# Patient Record
Sex: Female | Born: 1960
Health system: Southern US, Community
[De-identification: ages and names within clinical notes are randomized; demographics above are authoritative.]

## PROBLEM LIST (undated history)

## (undated) DIAGNOSIS — E119 Type 2 diabetes mellitus without complications: Secondary | ICD-10-CM

## (undated) DIAGNOSIS — I10 Essential (primary) hypertension: Secondary | ICD-10-CM

## (undated) DIAGNOSIS — F1910 Other psychoactive substance abuse, uncomplicated: Secondary | ICD-10-CM

---

## 2009-01-13 ENCOUNTER — Ambulatory Visit (HOSPITAL_COMMUNITY): Admission: RE | Admit: 2009-01-13 | Discharge: 2009-01-13 | Payer: Self-pay | Admitting: Obstetrics & Gynecology

## 2009-11-13 ENCOUNTER — Emergency Department (HOSPITAL_BASED_OUTPATIENT_CLINIC_OR_DEPARTMENT_OTHER): Admission: EM | Admit: 2009-11-13 | Discharge: 2009-11-13 | Payer: Self-pay | Admitting: Emergency Medicine

## 2009-12-22 ENCOUNTER — Emergency Department (HOSPITAL_BASED_OUTPATIENT_CLINIC_OR_DEPARTMENT_OTHER)
Admission: EM | Admit: 2009-12-22 | Discharge: 2009-12-22 | Payer: Self-pay | Source: Home / Self Care | Admitting: Emergency Medicine

## 2009-12-22 ENCOUNTER — Ambulatory Visit: Payer: Self-pay | Admitting: Diagnostic Radiology

## 2010-01-16 ENCOUNTER — Ambulatory Visit (HOSPITAL_COMMUNITY): Admission: RE | Admit: 2010-01-16 | Discharge: 2010-01-16 | Payer: Self-pay | Admitting: Family Medicine

## 2012-09-22 ENCOUNTER — Encounter (HOSPITAL_BASED_OUTPATIENT_CLINIC_OR_DEPARTMENT_OTHER): Payer: Self-pay | Admitting: *Deleted

## 2012-09-22 ENCOUNTER — Emergency Department (HOSPITAL_BASED_OUTPATIENT_CLINIC_OR_DEPARTMENT_OTHER)
Admission: EM | Admit: 2012-09-22 | Discharge: 2012-09-22 | Disposition: A | Discharge status: Home / Self Care | Payer: Self-pay | Attending: Emergency Medicine | Admitting: Emergency Medicine

## 2012-09-22 DIAGNOSIS — F17200 Nicotine dependence, unspecified, uncomplicated: Secondary | ICD-10-CM | POA: Insufficient documentation

## 2012-09-22 DIAGNOSIS — R509 Fever, unspecified: Secondary | ICD-10-CM | POA: Insufficient documentation

## 2012-09-22 DIAGNOSIS — R739 Hyperglycemia, unspecified: Secondary | ICD-10-CM

## 2012-09-22 DIAGNOSIS — R7309 Other abnormal glucose: Secondary | ICD-10-CM | POA: Insufficient documentation

## 2012-09-22 DIAGNOSIS — IMO0002 Reserved for concepts with insufficient information to code with codable children: Secondary | ICD-10-CM | POA: Insufficient documentation

## 2012-09-22 DIAGNOSIS — L02412 Cutaneous abscess of left axilla: Secondary | ICD-10-CM

## 2012-09-22 HISTORY — DX: Other psychoactive substance abuse, uncomplicated: F19.10

## 2012-09-22 LAB — GLUCOSE, CAPILLARY

## 2012-09-22 MED ORDER — OXYCODONE-ACETAMINOPHEN 5-325 MG PO TABS: 1.0000 | ORAL_TABLET | ORAL | Status: DC | PRN

## 2012-09-22 MED ORDER — HYDROMORPHONE HCL PF 1 MG/ML IJ SOLN
1.0000 mg | Freq: Once | INTRAMUSCULAR | Status: AC
  Administered 2012-09-22: 1 mg via INTRAMUSCULAR
  Filled 2012-09-22: qty 1

## 2012-09-22 MED ORDER — SULFAMETHOXAZOLE-TRIMETHOPRIM 800-160 MG PO TABS: 1.0000 | ORAL_TABLET | Freq: Two times a day (BID) | ORAL | Status: DC

## 2012-09-22 NOTE — ED Notes (Signed)
Left axiciallary abscess with scant purulent drainage very painful to touch pt medicated per order for pain. I&D tray with lidi/ epi at bedside.

## 2012-09-22 NOTE — ED Provider Notes (Signed)
History     This chart was scribed for Joya Gaskins, MD by Jiles Prows, ED Scribe. The patient was seen in room MH05/MH05 and the patient's care was started at 12:45 AM.   CSN: [id number] Arrival date & time 09/22/12  0023    Chief Complaint  Patient presents with  . Abscess   Patient is a 52 y.o. female presenting with abscess. The history is provided by the patient and medical records. No language interpreter was used.  Abscess Location:  Shoulder/arm Shoulder/arm abscess location:  L arm Abscess quality: painful   Red streaking: no   Duration:  6 days Progression:  Worsening Pain details:    Quality:  Pressure and throbbing   Severity:  Moderate   Duration:  6 days   Timing:  Constant   Progression:  Worsening Chronicity:  Recurrent Context: diabetes (possible diabetes)   Relieved by:  Nothing Worsened by:  Nothing tried Ineffective treatments:  None tried Associated symptoms: fever    HPI Comments: Jamie Frank is a 52 y.o. female who presents to the Emergency Department complaining of abscess to left axilla onset 6 days ago.  Pt reports history of abscess in this area.  She reports that she can normally apply clindamycin gel to area to clear up the problem.  She tried that this time with no relief.  Pt reports low grade fever (currently 98.7).  Pt denies headache, diaphoresis, chills, nausea, vomiting, diarrhea, weakness, cough, SOB and any other pain.   Past Medical History  Diagnosis Date  . Drug abuse     recovering for 2 yrs now   No past surgical history on file. No family history on file. History  Substance Use Topics  . Smoking status: Current Every Day Smoker    Types: Cigarettes  . Smokeless tobacco: Not on file  . Alcohol Use: No   OB History   Grav Para Term Preterm Abortions TAB SAB Ect Mult Living                 Review of Systems  Constitutional: Positive for fever.  Cardiovascular: Negative for chest pain.  Skin: Positive for  wound.  Neurological: Negative for weakness.  All other systems reviewed and are negative.    Allergies  Review of patient's allergies indicates not on file.  Home Medications   Current Outpatient Rx  Name  Route  Sig  Dispense  Refill  . metFORMIN (GLUCOPHAGE) 500 MG tablet   Oral   Take 500 mg by mouth 2 (two) times daily with a meal.          BP 128/63  Pulse 90  Temp(Src) 98.7 F (37.1 C) (Oral)  Resp 18  Ht 5\' 5"  (1.651 m)  Wt 260 lb (117.935 kg)  BMI 43.27 kg/m2  SpO2 98%  LMP 08/23/2012  Physical Exam CONSTITUTIONAL: Well developed/well nourished HEAD: Normocephalic/atraumatic EYES: EOMI/PERRL ENMT: Mucous membranes moist NECK: supple no meningeal signs SPINE:entire spine nontender CV: S1/S2 noted, no murmurs/rubs/gallops noted LUNGS: Lungs are clear to auscultation bilaterally, no apparent distress ABDOMEN: soft, nontender, no rebound or guarding GU:no cva tenderness NEURO: Pt is awake/alert, moves all extremitiesx4 EXTREMITIES: pulses normal, full ROM SKIN: warm, color normal, abscess to left axilla, no crepitus or drainage PSYCH: no abnormalities of mood noted   ED Course  Procedures   INCISION AND DRAINAGE Performed by: Joya Gaskins Consent: Verbal consent obtained. Risks and benefits: risks, benefits and alternatives were discussed Type: abscess  Body area: left  axilla  Anesthesia: local infiltration  Incision was made with a scalpel.  Local anesthetic: lidocaine 2% with epinephrine  Anesthetic total: 6 ml  Complexity: complex Blunt dissection to break up loculations  Drainage: purulent  Drainage amount: significant   Patient tolerance: Patient tolerated the procedure well with no immediate complications.     DIAGNOSTIC STUDIES: Oxygen Saturation is 98% on RA, normal by my interpretation.    COORDINATION OF CARE: 12:48 AM - Discussed ED treatment with pt at bedside including CBG, incision and drainage and pain  management (injection) and pt agrees.   Pt reported she "may have diabetes"  I told her that she does have diabetes.  I offered IV fluids, but she refused and reports she will f/u with her PCP for diabetic control.  She does not appear to be in DKA    Large amt of purulent material drained Two other areas of fluctuance were also incised, but no drainage from those wounds Pt stable for d/c and will start antibiotics  we discussed strict return precautions MDM  Nursing notes including past medical history and social history reviewed and considered in documentation Labs/vital reviewed and considered   I personally performed the services described in this documentation, which was scribed in my presence. The recorded information has been reviewed and is accurate.      Joya Gaskins, MD 09/22/12 [phone]

## 2012-09-22 NOTE — ED Notes (Signed)
Pt. Reports she has an abcess under the L arm x 6 days now.  Reports she has had abcess in the same area in past.

## 2015-04-13 ENCOUNTER — Encounter (HOSPITAL_BASED_OUTPATIENT_CLINIC_OR_DEPARTMENT_OTHER): Payer: Self-pay | Admitting: *Deleted

## 2015-04-13 ENCOUNTER — Emergency Department (HOSPITAL_BASED_OUTPATIENT_CLINIC_OR_DEPARTMENT_OTHER)
Admission: EM | Admit: 2015-04-13 | Discharge: 2015-04-13 | Disposition: A | Discharge status: Home / Self Care | Payer: Self-pay | Attending: Emergency Medicine | Admitting: Emergency Medicine

## 2015-04-13 DIAGNOSIS — F17210 Nicotine dependence, cigarettes, uncomplicated: Secondary | ICD-10-CM | POA: Insufficient documentation

## 2015-04-13 DIAGNOSIS — L0291 Cutaneous abscess, unspecified: Secondary | ICD-10-CM

## 2015-04-13 DIAGNOSIS — Z7984 Long term (current) use of oral hypoglycemic drugs: Secondary | ICD-10-CM | POA: Insufficient documentation

## 2015-04-13 DIAGNOSIS — E119 Type 2 diabetes mellitus without complications: Secondary | ICD-10-CM | POA: Insufficient documentation

## 2015-04-13 DIAGNOSIS — L02412 Cutaneous abscess of left axilla: Secondary | ICD-10-CM | POA: Insufficient documentation

## 2015-04-13 HISTORY — DX: Type 2 diabetes mellitus without complications: E11.9

## 2015-04-13 MED ORDER — OXYCODONE-ACETAMINOPHEN 5-325 MG PO TABS
1.0000 | ORAL_TABLET | Freq: Once | ORAL | Status: AC
  Administered 2015-04-13: 1 via ORAL
  Filled 2015-04-13: qty 1

## 2015-04-13 MED ORDER — LIDOCAINE-EPINEPHRINE (PF) 2 %-1:200000 IJ SOLN
10.0000 mL | Freq: Once | INTRAMUSCULAR | Status: AC
  Administered 2015-04-13: 10 mL via INTRADERMAL
  Filled 2015-04-13: qty 10

## 2015-04-13 MED ORDER — SULFAMETHOXAZOLE-TRIMETHOPRIM 800-160 MG PO TABS: 1.0000 | ORAL_TABLET | Freq: Two times a day (BID) | ORAL | Status: AC

## 2015-04-13 NOTE — ED Notes (Signed)
Abscess to her left axilla. 

## 2015-04-13 NOTE — ED Provider Notes (Signed)
CSN: [id number]     Arrival date & time 04/13/15  1915 History   First MD Initiated Contact with Patient 04/13/15 2030     Chief Complaint  Patient presents with  . Abscess     (Consider location/radiation/quality/duration/timing/severity/associated sxs/prior Treatment) Patient is a 55 y.o. female presenting with abscess. The history is provided by the patient and medical records. No language interpreter was used.  Abscess Associated symptoms: no fever, no headaches, no nausea and no vomiting    Jamie Frank is a 55 y.o. female  with a PMH of DM and multiple skin abscesses who presents to the Emergency Department for abscess of left axilla, worsening over the last 3-4 days. Pain is described as throbbing. Denies drainage. No medication taken PTA. Pt. States she has had >30 skin abscesses, multiple in each axilla - she has seen a specialist who told her that she has overactive sweat glands causing these abscesses. Denies fever at home.   Past Medical History  Diagnosis Date  . Drug abuse     recovering for 2 yrs now  . Diabetes mellitus without complication (HCC)    History reviewed. No pertinent past surgical history. No family history on file. Social History  Substance Use Topics  . Smoking status: Current Every Day Smoker    Types: Cigarettes  . Smokeless tobacco: None  . Alcohol Use: No   OB History    No data available     Review of Systems  Constitutional: Negative for fever and chills.  HENT: Negative for congestion.   Eyes: Negative for visual disturbance.  Respiratory: Negative for cough and shortness of breath.   Cardiovascular: Negative.   Gastrointestinal: Negative for nausea, vomiting and abdominal pain.  Genitourinary: Negative for dysuria.  Musculoskeletal: Negative for back pain and neck pain.  Skin: Positive for wound.  Neurological: Negative for dizziness, weakness and headaches.      Allergies  Review of patient's allergies indicates no known  allergies.  Home Medications   Prior to Admission medications   Medication Sig Start Date End Date Taking? Authorizing Provider  GLIPIZIDE PO Take by mouth.   Yes Historical Provider, MD  metFORMIN (GLUCOPHAGE) 500 MG tablet Take 500 mg by mouth 2 (two) times daily with a meal.    Historical Provider, MD  oxyCODONE-acetaminophen (PERCOCET/ROXICET) 5-325 MG per tablet Take 1 tablet by mouth every 4 (four) hours as needed for pain. 09/22/12   Zadie Rhine, MD  sulfamethoxazole-trimethoprim (BACTRIM DS,SEPTRA DS) 800-160 MG tablet Take 1 tablet by mouth 2 (two) times daily. 04/13/15 04/20/15  Chase Picket Abid Bolla, PA-C   BP 131/79 mmHg  Pulse 100  Temp(Src) 98.4 F (36.9 C) (Oral)  Resp 18  Ht  (1.651 m)  Wt 97.523 kg  BMI 35.78 kg/m2  SpO2 100% Physical Exam  Constitutional: She is oriented to person, place, and time. She appears well-developed and well-nourished.  Alert and in no acute distress  HENT:  Head: Normocephalic and atraumatic.  Cardiovascular: Normal rate, regular rhythm and normal heart sounds.  Exam reveals no gallop and no friction rub.   No murmur heard. Pulmonary/Chest: Effort normal and breath sounds normal. No respiratory distress. She has no wheezes. She has no rales.  Abdominal: Soft. She exhibits no distension. There is no tenderness.  Musculoskeletal: Normal range of motion.  Neurological: She is alert and oriented to person, place, and time.  Skin: Skin is warm and dry. No rash noted.  Left axilla with 2.5 cm tender area of  fluctuance with no surrounding erythema. No drainage.   Psychiatric: She has a normal mood and affect. Her behavior is normal. Judgment and thought content normal.  Nursing note and vitals reviewed.   ED Course  Procedures (including critical care time)  INCISION AND DRAINAGE Performed by: Chase Picket Mccade Sullenberger Consent: Verbal consent obtained. Risks and benefits: risks, benefits and alternatives were discussed Type: abscess Body  area: Left axilla Anesthesia: local infiltration Incision was made with a scalpel. Local anesthetic: lidocaine 2% with epinephrine Anesthetic total: 5 ml Complexity: complex Blunt dissection to break up loculations Drainage: purulent Drainage amount: copious  Packing material: 1/4 in iodoform gauze Patient tolerance: Patient tolerated the procedure well with no immediate complications.  Labs Review Labs Reviewed - No data to display  Imaging Review No results found. I have personally reviewed and evaluated these images and lab results as part of my medical decision-making.   EKG Interpretation None      MDM   Final diagnoses:  Abscess   Jamie Frank presents with abscess of left axilla which was I&D by me as dictated above. Pt. With hx of several skin abscesses who is followed by "specialist" - unsure what kind of physician she sees for this. Will treat with bactrim given frequency of recurrence and patient given general surgery follow up. Home care instructions, return precautions, and follow up instructions given to patient - all questions answered.   [facility] Jason Frisbee, PA-C 04/13/15 2440  Doug Sou, MD 04/13/15 [phone]

## 2015-04-13 NOTE — ED Notes (Signed)
C/o abscess under left arm x 4 days

## 2015-04-13 NOTE — Discharge Instructions (Signed)
Followup with your doctor or an urgent care in order to remove your packing in 48-72 hours. You may return to the emergency department if you have  a fever that persists greater than 101 or your abscess appears to become infected (growing surrounding redness and warmth). ° °Abscess °An abscess (boil or furuncle) is an infected area that contains a collection of pus.  °SYMPTOMS °Signs and symptoms of an abscess include pain, tenderness, redness, or hardness. You may feel a moveable soft area under your skin. An abscess can occur anywhere in the body.  °TREATMENT  °A surgical cut (incision) may be made over your abscess to drain the pus. Gauze may be packed into the space or a drain may be looped through the abscess cavity (pocket). This provides a drain that will allow the cavity to heal from the inside outwards. The abscess may be painful for a few days, but should feel much better if it was drained.  °Your abscess, if seen early, may not have localized and may not have been drained. If not, another appointment may be required if it does not get better on its own or with medications. °HOME CARE INSTRUCTIONS  °· Only take over-the-counter or prescription medicines for pain, discomfort, or fever as directed by your caregiver.  °· Take your antibiotics as directed if they were prescribed. Finish them even if you start to feel better.  °· Keep the skin and clothes clean around your abscess.  °· If the abscess was drained, you will need to use gauze dressing to collect any draining pus. Dressings will typically need to be changed 3 or more times a day.  °· The infection may spread by skin contact with others. Avoid skin contact as much as possible.  °· Practice good hygiene. This includes regular hand washing, cover any draining skin lesions, and do not share personal care items.  °· If you participate in sports, do not share athletic equipment, towels, whirlpools, or personal care items. Shower after every practice or  tournament.  °· If a draining area cannot be adequately covered:  °· Do not participate in sports.  °· Children should not participate in day care until the wound has healed or drainage stops.  °· If your caregiver has given you a follow-up appointment, it is very important to keep that appointment. Not keeping the appointment could result in a much worse infection, chronic or permanent injury, pain, and disability. If there is any problem keeping the appointment, you must call back to this facility for assistance.  °SEEK MEDICAL CARE IF:  °· You develop increased pain, swelling, redness, drainage, or bleeding in the wound site.  °· You develop signs of generalized infection including muscle aches, chills, fever, or a general ill feeling.  °· You have an oral temperature above 102° F (38.9° C).  °MAKE SURE YOU:  °· Understand these instructions.  °· Will watch your condition.  °· Will get help right away if you are not doing well or get worse.  °Document Released: 11/28/2004 Document Revised: 10/31/2010 Document Reviewed: 09/22/2007 °Exit[employer]® Patient Information ©2012 [employer]. ° °

## 2015-08-08 ENCOUNTER — Emergency Department (HOSPITAL_BASED_OUTPATIENT_CLINIC_OR_DEPARTMENT_OTHER)
Admission: EM | Admit: 2015-08-08 | Discharge: 2015-08-08 | Disposition: A | Discharge status: Home / Self Care | Payer: Self-pay | Attending: Emergency Medicine | Admitting: Emergency Medicine

## 2015-08-08 ENCOUNTER — Encounter (HOSPITAL_BASED_OUTPATIENT_CLINIC_OR_DEPARTMENT_OTHER): Payer: Self-pay | Admitting: *Deleted

## 2015-08-08 DIAGNOSIS — J069 Acute upper respiratory infection, unspecified: Secondary | ICD-10-CM | POA: Insufficient documentation

## 2015-08-08 DIAGNOSIS — Z7984 Long term (current) use of oral hypoglycemic drugs: Secondary | ICD-10-CM | POA: Insufficient documentation

## 2015-08-08 DIAGNOSIS — F17210 Nicotine dependence, cigarettes, uncomplicated: Secondary | ICD-10-CM | POA: Insufficient documentation

## 2015-08-08 DIAGNOSIS — E119 Type 2 diabetes mellitus without complications: Secondary | ICD-10-CM | POA: Insufficient documentation

## 2015-08-08 LAB — RAPID STREP SCREEN (MED CTR MEBANE ONLY): STREPTOCOCCUS, GROUP A SCREEN (DIRECT): NEGATIVE

## 2015-08-08 MED ORDER — GUAIFENESIN-CODEINE 100-10 MG/5ML PO SOLN: 5.0000 mL | Freq: Three times a day (TID) | ORAL | Status: DC | PRN

## 2015-08-08 NOTE — ED Notes (Signed)
Sore throat x 2 days

## 2015-08-08 NOTE — ED Provider Notes (Signed)
CSN: [id number]     Arrival date & time 08/08/15  2053 History   First MD Initiated Contact with Patient 08/08/15 2119     Chief Complaint  Patient presents with  . Sore Throat   HPI   55 year old female presents today with upper respiratory complaints. Patient reports that 3 days ago she started experiencing subjective fevers, cough, sore throat. Patient notes that symptoms have persisted, cough is dry and nonproductive, reports rhinorrhea, congestion. She denies any chest pain or shortness of breath, reports no fever today, no abdominal pain nausea or vomiting. Patient reports her throat is sore, it is painful to swallow, but has no difficulty swallowing or tolerating by mouth. Patient is smoker, she denies any weight loss, night sweats, preceding illness prior to this. Patient has no prior respiratory complaints. Patient reports using sinus medication which has not improved her cough or sore throat.   Past Medical History  Diagnosis Date  . Drug abuse     recovering for 2 yrs now  . Diabetes mellitus without complication (HCC)    History reviewed. No pertinent past surgical history. No family history on file. Social History  Substance Use Topics  . Smoking status: Current Every Day Smoker    Types: Cigarettes  . Smokeless tobacco: None  . Alcohol Use: No   OB History    No data available     Review of Systems  All other systems reviewed and are negative.   Allergies  Review of patient's allergies indicates no known allergies.  Home Medications   Prior to Admission medications   Medication Sig Start Date End Date Taking? Authorizing Provider  GLIPIZIDE PO Take by mouth.    Historical Provider, MD  guaiFENesin-codeine 100-10 MG/5ML syrup Take 5 mLs by mouth 3 (three) times daily as needed for cough. 08/08/15   Eyvonne Mechanic, PA-C  metFORMIN (GLUCOPHAGE) 500 MG tablet Take 500 mg by mouth 2 (two) times daily with a meal.    Historical Provider, MD  oxyCODONE-acetaminophen  (PERCOCET/ROXICET) 5-325 MG per tablet Take 1 tablet by mouth every 4 (four) hours as needed for pain. 09/22/12   Zadie Rhine, MD   BP 122/76 mmHg  Pulse 101  Temp(Src) 98.7 F (37.1 C) (Oral)  Resp 20  Ht  (1.651 m)  Wt 97.523 kg  BMI 35.78 kg/m2  SpO2 97%   Physical Exam  Constitutional: She is oriented to person, place, and time. She appears well-developed and well-nourished.  HENT:  Head: Normocephalic and atraumatic.  Right Ear: Hearing and tympanic membrane normal.  Left Ear: Hearing and tympanic membrane normal.  Nose: Nose normal. Right sinus exhibits no maxillary sinus tenderness and no frontal sinus tenderness. Left sinus exhibits no maxillary sinus tenderness and no frontal sinus tenderness.  Mouth/Throat: Uvula is midline, oropharynx is clear and moist and mucous membranes are normal. No oropharyngeal exudate, posterior oropharyngeal edema, posterior oropharyngeal erythema or tonsillar abscesses.  Eyes: Conjunctivae are normal. Pupils are equal, round, and reactive to light. Right eye exhibits no discharge. Left eye exhibits no discharge. No scleral icterus.  Neck: Normal range of motion. No JVD present. No tracheal deviation present.  Cardiovascular: Regular rhythm, normal heart sounds and intact distal pulses.  Exam reveals no gallop and no friction rub.   No murmur heard. Pulmonary/Chest: Effort normal and breath sounds normal. No stridor. No respiratory distress. She has no wheezes. She has no rales. She exhibits no tenderness.  Neurological: She is alert and oriented to person, place, and  time. Coordination normal.  Skin: Skin is warm and dry. No rash noted. No erythema. No pallor.  Psychiatric: She has a normal mood and affect. Her behavior is normal. Judgment and thought content normal.  Nursing note and vitals reviewed.    ED Course  Procedures (including critical care time) Labs Review Labs Reviewed  RAPID ST[facility]CREEN [facility])  CULTURE, GROUP A  STREP [facility])    Imaging Review No results found. I have personally reviewed and evaluated these images and lab results as part of my medical decision-making.   EKG Interpretation None      MDM   Final diagnoses:  Viral URI    Labs:   Imaging:  Consults:  Therapeutics:  Discharge Meds:   Assessment/Plan: 55 year old female presents today with likely viral URI. She is afebrile, nontoxic in no acute distress. She has clear lung sounds, no respiratory distress. Patient be treated symptomatically for her cough and sore throat, encouraged follow-up with the primary care, and strict return precautions given.        Eyvonne MechanicJeffrey Glenwood Revoir, PA-C 08/09/15 0101  Doug SouSam Jacubowitz, MD 08/09/15 1505

## 2015-08-08 NOTE — Discharge Instructions (Signed)
Please follow-up with your primary care provider or the ED if any new or worsening signs or symptoms present. Please use medication as directed, do not drink or drive all taking medication. Upper Respiratory Infection, Adult Most upper respiratory infections (URIs) are a viral infection of the air passages leading to the lungs. A URI affects the nose, throat, and upper air passages. The most common type of URI is nasopharyngitis and is typically referred to as "the common cold." URIs run their course and usually go away on their own. Most of the time, a URI does not require medical attention, but sometimes a bacterial infection in the upper airways can follow a viral infection. This is called a secondary infection. Sinus and middle ear infections are common types of secondary upper respiratory infections. Bacterial pneumonia can also complicate a URI. A URI can worsen asthma and chronic obstructive pulmonary disease (COPD). Sometimes, these complications can require emergency medical care and may be life threatening.  CAUSES Almost all URIs are caused by viruses. A virus is a type of germ and can spread from one person to another.  RISKS FACTORS You may be at risk for a URI if:   You smoke.   You have chronic heart or lung disease.  You have a weakened defense (immune) system.   You are very young or very old.   You have nasal allergies or asthma.  You work in crowded or poorly ventilated areas.  You work in health care facilities or schools. SIGNS AND SYMPTOMS  Symptoms typically develop 2-3 days after you come in contact with a cold virus. Most viral URIs last 7-10 days. However, viral URIs from the influenza virus (flu virus) can last 14-18 days and are typically more severe. Symptoms may include:   Runny or stuffy (congested) nose.   Sneezing.   Cough.   Sore throat.   Headache.   Fatigue.   Fever.   Loss of appetite.   Pain in your forehead, behind your eyes,  and over your cheekbones (sinus pain).  Muscle aches.  DIAGNOSIS  Your health care provider may diagnose a URI by:  Physical exam.  Tests to check that your symptoms are not due to another condition such as:  Strep throat.  Sinusitis.  Pneumonia.  Asthma. TREATMENT  A URI goes away on its own with time. It cannot be cured with medicines, but medicines may be prescribed or recommended to relieve symptoms. Medicines may help:  Reduce your fever.  Reduce your cough.  Relieve nasal congestion. HOME CARE INSTRUCTIONS   Take medicines only as directed by your health care provider.   Gargle warm saltwater or take cough drops to comfort your throat as directed by your health care provider.  Use a warm mist humidifier or inhale steam from a shower to increase air moisture. This may make it easier to breathe.  Drink enough fluid to keep your urine clear or pale yellow.   Eat soups and other clear broths and maintain good nutrition.   Rest as needed.   Return to work when your temperature has returned to normal or as your health care provider advises. You may need to stay home longer to avoid infecting others. You can also use a face mask and careful hand washing to prevent spread of the virus.  Increase the usage of your inhaler if you have asthma.   Do not use any tobacco products, including cigarettes, chewing tobacco, or electronic cigarettes. If you need help quitting, ask  your health care provider. PREVENTION  The best way to protect yourself from getting a cold is to practice good hygiene.   Avoid oral or hand contact with people with cold symptoms.   Wash your hands often if contact occurs.  There is no clear evidence that vitamin C, vitamin E, echinacea, or exercise reduces the chance of developing a cold. However, it is always recommended to get plenty of rest, exercise, and practice good nutrition.  SEEK MEDICAL CARE IF:   You are getting worse rather than  better.   Your symptoms are not controlled by medicine.   You have chills.  You have worsening shortness of breath.  You have brown or red mucus.  You have yellow or brown nasal discharge.  You have pain in your face, especially when you bend forward.  You have a fever.  You have swollen neck glands.  You have pain while swallowing.  You have white areas in the back of your throat. SEEK IMMEDIATE MEDICAL CARE IF:   You have severe or persistent:  Headache.  Ear pain.  Sinus pain.  Chest pain.  You have chronic lung disease and any of the following:  Wheezing.  Prolonged cough.  Coughing up blood.  A change in your usual mucus.  You have a stiff neck.  You have changes in your:  Vision.  Hearing.  Thinking.  Mood. MAKE SURE YOU:   Understand these instructions.  Will watch your condition.  Will get help right away if you are not doing well or get worse.   This information is not intended to replace advice given to you by your health care provider. Make sure you discuss any questions you have with your health care provider.   Document Released: 08/14/2000 Document Revised: 07/05/2014 Document Reviewed: 05/26/2013 Elsevier Interactive Patient Education [employer].

## 2015-08-11 LAB — CULTURE, GROUP A STREP (THRC)

## 2016-05-16 ENCOUNTER — Emergency Department (HOSPITAL_BASED_OUTPATIENT_CLINIC_OR_DEPARTMENT_OTHER): Payer: Self-pay

## 2016-05-16 ENCOUNTER — Emergency Department (HOSPITAL_BASED_OUTPATIENT_CLINIC_OR_DEPARTMENT_OTHER)
Admission: EM | Admit: 2016-05-16 | Discharge: 2016-05-16 | Disposition: A | Discharge status: Home / Self Care | Payer: Self-pay | Attending: Emergency Medicine | Admitting: Emergency Medicine

## 2016-05-16 ENCOUNTER — Encounter (HOSPITAL_BASED_OUTPATIENT_CLINIC_OR_DEPARTMENT_OTHER): Payer: Self-pay

## 2016-05-16 DIAGNOSIS — J069 Acute upper respiratory infection, unspecified: Secondary | ICD-10-CM | POA: Insufficient documentation

## 2016-05-16 DIAGNOSIS — F17210 Nicotine dependence, cigarettes, uncomplicated: Secondary | ICD-10-CM | POA: Insufficient documentation

## 2016-05-16 DIAGNOSIS — B9789 Other viral agents as the cause of diseases classified elsewhere: Secondary | ICD-10-CM

## 2016-05-16 DIAGNOSIS — Z7984 Long term (current) use of oral hypoglycemic drugs: Secondary | ICD-10-CM | POA: Insufficient documentation

## 2016-05-16 DIAGNOSIS — E119 Type 2 diabetes mellitus without complications: Secondary | ICD-10-CM | POA: Insufficient documentation

## 2016-05-16 MED ORDER — DM-GUAIFENESIN ER 30-600 MG PO TB12: 1.0000 | ORAL_TABLET | Freq: Two times a day (BID) | ORAL | 0 refills | Status: DC

## 2016-05-16 MED ORDER — FLUTICASONE PROPIONATE 50 MCG/ACT NA SUSP: 1.0000 | Freq: Every day | NASAL | 0 refills | Status: DC

## 2016-05-16 NOTE — ED Triage Notes (Signed)
c/o flu like sx x 6 days-states she needs a RTW note-NAD-steady gait

## 2016-05-16 NOTE — Discharge Instructions (Signed)
Please read and follow all provided instructions.  Your diagnoses today include:  1. Viral URI with cough     You appear to have an upper respiratory infection (URI). An upper respiratory tract infection, or cold, is a viral infection of the air passages leading to the lungs. It should improve gradually after 5-7 days. You may have a lingering cough that lasts for 2- 4 weeks after the infection.  Tests performed today include: Vital signs. See below for your results today.   Medications prescribed:   Take any prescribed medications only as directed. Treatment for your infection is aimed at treating the symptoms. There are no medications, such as antibiotics, that will cure your infection.   Home care instructions:  Follow any educational materials contained in this packet.   Your illness is contagious and can be spread to others, especially during the first 3 or 4 days. It cannot be cured by antibiotics or other medicines. Take basic precautions such as washing your hands often, covering your mouth when you cough or sneeze, and avoiding public places where you could spread your illness to others.   Please continue drinking plenty of fluids.  Use over-the-counter medicines as needed as directed on packaging for symptom relief.  You may also use ibuprofen or tylenol as directed on packaging for pain or fever.  Do not take multiple medicines containing Tylenol or acetaminophen to avoid taking too much of this medication.  Follow-up instructions: Please follow-up with your primary care provider in the next 3 days for further evaluation of your symptoms if you are not feeling better.   Return instructions:  Please return to the Emergency Department if you experience worsening symptoms.  RETURN IMMEDIATELY IF you develop shortness of breath, confusion or altered mental status, a new rash, become dizzy, faint, or poorly responsive, or are unable to be cared for at home. Please return if you have  persistent vomiting and cannot keep down fluids or develop a fever that is not controlled by tylenol or motrin.   Please return if you have any other emergent concerns.  Additional Information:  Your vital signs today were: BP (!) 138/96 (BP Location: Left Arm)    Pulse 73    Temp 98.1 F (36.7 C) (Oral)    Resp 18    Ht 5\' 5"  (1.651 m)    Wt 86.2 kg    SpO2 99%    BMI 31.62 kg/m  If your blood pressure (BP) was elevated above 135/85 this visit, please have this repeated by your doctor within one month. --------------

## 2016-05-16 NOTE — ED Provider Notes (Signed)
[facility]   CSN: [id number] Arrival date & time: 05/16/16  1548  By signing my name below, I, Rosario AdieWilliam Andrew Hiatt, attest that this documentation has been prepared under the direction and in the presence of Audry Piliyler Darline Faith, PA-C.  Electronically Signed: Rosario AdieWilliam Andrew Hiatt, ED Scribe. 05/16/16. 4:07 PM.  History   Chief Complaint Chief Complaint  Patient presents with  . Cough   The history is provided by the patient. No language interpreter was used.    HPI Comments: Jamie Frank is a 56 y.o. female with a h/o DM, who presents to the Emergency Department complaining of persistent, gradually worsening productive cough beginning six days ago. She notes associated chills, loss of appetite, rhinorrhea, congestion, and right ear pain. She additionally notes that she did have a fever several days ago (Tmax 101), but this has since resolved. Pt has been taking OTC cold relief medications without relief of her symptoms. She is a current, everyday smoker. Pt denies nausea, vomiting, diarrhea, chest pain, abdominal pain, or any other associated symptoms.   Past Medical History:  Diagnosis Date  . Diabetes mellitus without complication (HCC)   . Drug abuse    recovering for 2 yrs now   There are no active problems to display for this patient.  History reviewed. No pertinent surgical history.  OB History    No data available     Home Medications    Prior to Admission medications   Medication Sig Start Date End Date Taking? Authorizing Provider  GLIPIZIDE PO Take by mouth.    Historical Provider, MD  metFORMIN (GLUCOPHAGE) 500 MG tablet Take 500 mg by mouth 2 (two) times daily with a meal.    Historical Provider, MD   Family History No family history on file.  Social History Social History  Substance Use Topics  . Smoking status: Current Every Day Smoker    Types: Cigarettes  . Smokeless tobacco: Never Used  . Alcohol use No   Allergies   Patient  has no known allergies.  Review of Systems Review of Systems  Constitutional: Positive for appetite change, chills and fever (Tmax 101, resolved).  HENT: Positive for congestion, ear pain and rhinorrhea.   Respiratory: Positive for cough.   Cardiovascular: Negative for chest pain.  Gastrointestinal: Negative for abdominal pain, diarrhea, nausea and vomiting.   Physical Exam Updated Vital Signs BP (!) 138/96 (BP Location: Left Arm)   Pulse 73   Temp 98.1 F (36.7 C) (Oral)   Resp 18   Ht 5\' 5"  (1.651 m)   Wt 190 lb (86.2 kg)   SpO2 99%   BMI 31.62 kg/m   Physical Exam  Constitutional: She is oriented to person, place, and time. She appears well-developed and well-nourished. No distress.  HENT:  Head: Normocephalic and atraumatic.  Right Ear: Tympanic membrane, external ear and ear canal normal.  Left Ear: Tympanic membrane, external ear and ear canal normal.  Nose: Nose normal.  Mouth/Throat: Uvula is midline, oropharynx is clear and moist and mucous membranes are normal. No trismus in the jaw. No oropharyngeal exudate, posterior oropharyngeal erythema or tonsillar abscesses.  Eyes: Conjunctivae and EOM are normal. Pupils are equal, round, and reactive to light.  Neck: Normal range of motion. Neck supple. No tracheal deviation present.  Cardiovascular: Normal rate, regular rhythm, S1 normal, S2 normal, normal heart sounds, intact distal pulses and normal pulses.   Pulmonary/Chest: Effort normal and breath sounds normal. No respiratory distress. She has no decreased  breath sounds. She has no wheezes. She has no rhonchi. She has no rales.  Abdominal: Normal appearance and bowel sounds are normal. She exhibits no distension. There is no tenderness.  Musculoskeletal: Normal range of motion.  Neurological: She is alert and oriented to person, place, and time.  Skin: Skin is warm and dry. No pallor.  Psychiatric: She has a normal mood and affect. Her speech is normal and behavior is  normal. Thought content normal.  Nursing note and vitals reviewed.  ED Treatments / Results  DIAGNOSTIC STUDIES: Oxygen Saturation is 99% on RA, normal by my interpretation.   COORDINATION OF CARE: 4:07 PM-Discussed next steps with pt. Pt verbalized understanding and is agreeable with the plan.   Labs (all labs ordered are listed, but only abnormal results are displayed) Labs Reviewed - No data to display  EKG  EKG Interpretation None      Radiology No results found.  Procedures Procedures   Medications Ordered in ED Medications - No data to display  Initial Impression / Assessment and Plan / ED Course  I have reviewed the triage vital signs and the nursing notes.  Pertinent labs & imaging results that were available during my care of the patient were reviewed by me and considered in my medical decision making (see chart for details).  I have reviewed and evaluated the relevant imaging studies. I obtained HPI from historian.  ED Course:  Assessment: Pt is a 56 y.o. female presents with cough, congestion, rhinorrhea, general URI-like symptoms over the past six days. On exam, pt in NAD. VSS. Afebrile. Lungs CTA, Heart RRR. Abdomen nontender/soft. Pt CXR negative for acute infiltrate. Patients symptoms are consistent with URI, likely viral etiology. Discussed that antibiotics are not indicated for viral infections. Pt will be discharged with symptomatic treatment.  Given Rx Flonase and Mucinex. Verbalizes understanding and is agreeable with plan. Pt is hemodynamically stable & in NAD prior to dc.  Disposition/Plan:  DC Home Additional Verbal discharge instructions given and discussed with patient.  Pt Instructed to f/u with PCP in the next week for evaluation and treatment of symptoms. Return precautions given Pt acknowledges and agrees with plan  Supervising Physician Laurence Spates, MD  Final Clinical Impressions(s) / ED Diagnoses   Final diagnoses:  Viral  URI with cough   New Prescriptions New Prescriptions   No medications on file    I personally performed the services described in this documentation, which was scribed in my presence. The recorded information has been reviewed and is accurate.    Audry Pili, PA-C 05/16/16 1640    Laurence Spates, MD 05/17/16 1155

## 2016-05-16 NOTE — ED Notes (Signed)
ED Provider at bedside. 

## 2016-07-18 ENCOUNTER — Emergency Department (HOSPITAL_BASED_OUTPATIENT_CLINIC_OR_DEPARTMENT_OTHER): Payer: Self-pay

## 2016-07-18 ENCOUNTER — Emergency Department (HOSPITAL_BASED_OUTPATIENT_CLINIC_OR_DEPARTMENT_OTHER)
Admission: EM | Admit: 2016-07-18 | Discharge: 2016-07-18 | Disposition: A | Discharge status: Home / Self Care | Payer: Self-pay | Attending: Emergency Medicine | Admitting: Emergency Medicine

## 2016-07-18 ENCOUNTER — Encounter (HOSPITAL_BASED_OUTPATIENT_CLINIC_OR_DEPARTMENT_OTHER): Payer: Self-pay | Admitting: *Deleted

## 2016-07-18 DIAGNOSIS — N201 Calculus of ureter: Secondary | ICD-10-CM

## 2016-07-18 DIAGNOSIS — E119 Type 2 diabetes mellitus without complications: Secondary | ICD-10-CM | POA: Insufficient documentation

## 2016-07-18 DIAGNOSIS — N200 Calculus of kidney: Secondary | ICD-10-CM

## 2016-07-18 DIAGNOSIS — Z7984 Long term (current) use of oral hypoglycemic drugs: Secondary | ICD-10-CM | POA: Insufficient documentation

## 2016-07-18 DIAGNOSIS — N202 Calculus of kidney with calculus of ureter: Secondary | ICD-10-CM | POA: Insufficient documentation

## 2016-07-18 DIAGNOSIS — F17210 Nicotine dependence, cigarettes, uncomplicated: Secondary | ICD-10-CM | POA: Insufficient documentation

## 2016-07-18 LAB — COMPREHENSIVE METABOLIC PANEL
ALBUMIN: 3.4 g/dL — AB (ref 3.5–5.0)
ALK PHOS: 70 U/L (ref 38–126)
ALT: 15 U/L (ref 14–54)
AST: 18 U/L (ref 15–41)
Anion gap: 9 (ref 5–15)
BUN: 13 mg/dL (ref 6–20)
CALCIUM: 9.5 mg/dL (ref 8.9–10.3)
CO2: 25 mmol/L (ref 22–32)
Chloride: 106 mmol/L (ref 101–111)
Creatinine, Ser: 0.81 mg/dL (ref 0.44–1.00)
GFR calc Af Amer: >60 mL/min (ref >60)
GFR calc non Af Amer: >60 mL/min (ref >60)
GLUCOSE: 82 mg/dL (ref 65–99)
Potassium: 3.5 mmol/L (ref 3.5–5.1)
SODIUM: 140 mmol/L (ref 135–145)
Total Bilirubin: 0.2 mg/dL — ABNORMAL LOW (ref 0.3–1.2)
Total Protein: 6.5 g/dL (ref 6.5–8.1)

## 2016-07-18 LAB — URINALYSIS, ROUTINE W REFLEX MICROSCOPIC
BILIRUBIN URINE: NEGATIVE
BILIRUBIN URINE: NEGATIVE
Glucose, UA: NEGATIVE mg/dL
Glucose, UA: NEGATIVE mg/dL
Ketones, ur: NEGATIVE mg/dL
Ketones, ur: NEGATIVE mg/dL
NITRITE: NEGATIVE
Nitrite: NEGATIVE
PH: 6 (ref 5.0–8.0)
PROTEIN: NEGATIVE mg/dL
PROTEIN: NEGATIVE mg/dL
SPECIFIC GRAVITY, URINE: 1.012 (ref 1.005–1.030)
SPECIFIC GRAVITY, URINE: 1.022 (ref 1.005–1.030)
pH: 6 (ref 5.0–8.0)

## 2016-07-18 LAB — CBC WITH DIFFERENTIAL/PLATELET
BASOS PCT: 1 %
Basophils Absolute: 0 10*3/uL (ref 0.0–0.1)
EOS ABS: 0.1 10*3/uL (ref 0.0–0.7)
Eosinophils Relative: 1 %
HCT: 40.7 % (ref 36.0–46.0)
HEMOGLOBIN: 14.3 g/dL (ref 12.0–15.0)
Lymphocytes Relative: 37 %
Lymphs Abs: 2.7 10*3/uL (ref 0.7–4.0)
MCH: 31 pg (ref 26.0–34.0)
MCHC: 35.1 g/dL (ref 30.0–36.0)
MCV: 88.3 fL (ref 78.0–100.0)
Monocytes Absolute: 0.7 10*3/uL (ref 0.1–1.0)
Monocytes Relative: 10 %
NEUTROS PCT: 51 %
Neutro Abs: 3.7 10*3/uL (ref 1.7–7.7)
Platelets: 176 10*3/uL (ref 150–400)
RBC: 4.61 MIL/uL (ref 3.87–5.11)
RDW: 13.2 % (ref 11.5–15.5)
WBC: 7.2 10*3/uL (ref 4.0–10.5)

## 2016-07-18 LAB — URINALYSIS, MICROSCOPIC (REFLEX)

## 2016-07-18 LAB — LIPASE, BLOOD: LIPASE: 23 U/L (ref 11–51)

## 2016-07-18 MED ORDER — PROMETHAZINE HCL 25 MG PO TABS: 25.0000 mg | ORAL_TABLET | Freq: Four times a day (QID) | ORAL | 0 refills | Status: AC | PRN

## 2016-07-18 MED ORDER — OXYCODONE-ACETAMINOPHEN 5-325 MG PO TABS: 1.0000 | ORAL_TABLET | ORAL | 0 refills | Status: AC | PRN

## 2016-07-18 MED ORDER — TAMSULOSIN HCL 0.4 MG PO CAPS: 0.4000 mg | ORAL_CAPSULE | Freq: Every day | ORAL | 0 refills | Status: AC

## 2016-07-18 MED ORDER — ONDANSETRON HCL 4 MG/2ML IJ SOLN
4.0000 mg | Freq: Once | INTRAMUSCULAR | Status: AC
  Administered 2016-07-18: 4 mg via INTRAVENOUS
  Filled 2016-07-18: qty 2

## 2016-07-18 MED ORDER — IOPAMIDOL (ISOVUE-300) INJECTION 61%
100.0000 mL | Freq: Once | INTRAVENOUS | Status: AC | PRN
  Administered 2016-07-18: 100 mL via INTRAVENOUS

## 2016-07-18 MED ORDER — SODIUM CHLORIDE 0.9 % IV BOLUS (SEPSIS)
1000.0000 mL | Freq: Once | INTRAVENOUS | Status: AC
  Administered 2016-07-18: 1000 mL via INTRAVENOUS

## 2016-07-18 MED ORDER — MORPHINE SULFATE (PF) 4 MG/ML IV SOLN
4.0000 mg | Freq: Once | INTRAVENOUS | Status: AC
  Administered 2016-07-18: 4 mg via INTRAVENOUS
  Filled 2016-07-18: qty 1

## 2016-07-18 MED FILL — PROMETHAZINE 25 MG TABLET: 25 | 4 days supply | Qty: 15 | Fill #0

## 2016-07-18 MED FILL — TAMSULOSIN HCL 0.4 MG CAP: 0.4 | 30 days supply | Qty: 30 | Fill #0

## 2016-07-18 MED FILL — OXYCODONE/APAP 5/325 MG TAB: 5-325 | 2 days supply | Qty: 15 | Fill #0

## 2016-07-18 NOTE — ED Triage Notes (Signed)
Pt amb to room 1 with quick steady gait smiling in nad. Pt reports 1.5 weeks of generalized abd pain with diarrhea. Denies any v/d or fevers.

## 2016-07-18 NOTE — Discharge Instructions (Signed)
Read the information below.  Use the prescribed medication as directed.  Please discuss all new medications with your pharmacist.  Do not take additional tylenol while taking the prescribed pain medication to avoid overdose.  You may return to the Emergency Department at any time for worsening condition or any new symptoms that concern you.    If you develop high fevers, worsening abdominal pain, uncontrolled vomiting, or are unable to tolerate fluids by mouth, return to the ER for a recheck.   °

## 2016-07-18 NOTE — ED Notes (Signed)
Pt refusing In and Out Cath - notified EDP.

## 2016-07-18 NOTE — ED Provider Notes (Signed)
[facility]   CSN: [id number] Arrival date & time: 07/18/16  1021     History   Chief Complaint Chief Complaint  Patient presents with  . Abdominal Pain    HPI Jamie Frank is a 56 y.o. female.  HPI   Pt with hx DM p/w lower abdominal pain, nausea, and diarrhea x 1.5 weeks.  Has diarrhea whenever she eats anything. Had N/V a few days ago only.  Increased belching.  Denies fevers, urinary symptoms, hematochezia.  NO hx abdominal surgeries.  Unknown sick contacts.  No recent travel or abnormal/concerning foods.  No recent antibiotics.  Past Medical History:  Diagnosis Date  . Diabetes mellitus without complication (HCC)   . Drug abuse    recovering for 2 yrs now    There are no active problems to display for this patient.   History reviewed. No pertinent surgical history.  OB History    No data available       Home Medications    Prior to Admission medications   Medication Sig Start Date End Date Taking? Authorizing Provider  GLIPIZIDE PO Take by mouth.    [provider]  metFORMIN (GLUCOPHAGE) 500 MG tablet Take 500 mg by mouth 2 (two) times daily with a meal.    [provider]  oxyCODONE-acetaminophen (PERCOCET/ROXICET) 5-325 MG tablet Take 1-2 tablets by mouth every 4 (four) hours as needed for severe pain. 07/18/16   Trixie Dredge, PA-C  promethazine (PHENERGAN) 25 MG tablet Take 1 tablet (25 mg total) by mouth every 6 (six) hours as needed for nausea. 07/18/16   Trixie Dredge, PA-C  tamsulosin (FLOMAX) 0.4 MG CAPS capsule Take 1 capsule (0.4 mg total) by mouth daily. 07/18/16   Trixie Dredge, PA-C    Family History History reviewed. No pertinent family history.  Social History Social History  Substance Use Topics  . Smoking status: Current Every Day Smoker    Types: Cigarettes  . Smokeless tobacco: Never Used  . Alcohol use No     Allergies   Patient has no known allergies.   Review of Systems Review of  Systems  All other systems reviewed and are negative.    Physical Exam Updated Vital Signs BP 129/65   Pulse 82   Temp 97.5 F (36.4 C) (Oral)   Resp 16   Ht 5\' 5"  (1.651 m)   Wt 88.4 kg   SpO2 97%   BMI 32.43 kg/m   Physical Exam  Constitutional: She appears well-developed and well-nourished. No distress.  HENT:  Head: Normocephalic and atraumatic.  Neck: Neck supple.  Cardiovascular: Normal rate and regular rhythm.   Pulmonary/Chest: Effort normal and breath sounds normal. No respiratory distress. She has no wheezes. She has no rales.  Abdominal: Soft. She exhibits no distension. There is tenderness in the suprapubic area and left lower quadrant. There is no rebound and no guarding.  Neurological: She is alert.  Skin: She is not diaphoretic.  Nursing note and vitals reviewed.    ED Treatments / Results  Labs (all labs ordered are listed, but only abnormal results are displayed) Labs Reviewed  COMPREHENSIVE METABOLIC PANEL - Abnormal; Notable for the following:       Result Value   Albumin 3.4 (*)    Total Bilirubin 0.2 (*)    All other components within normal limits  URINALYSIS, ROUTINE W REFLEX MICROSCOPIC - Abnormal; Notable for the following:    APPearance CLOUDY (*)    Hgb urine dipstick  LARGE (*)    Leukocytes, UA LARGE (*)    All other components within normal limits  URINALYSIS, MICROSCOPIC (REFLEX) - Abnormal; Notable for the following:    Bacteria, UA MANY (*)    Squamous Epithelial / LPF 6-30 (*)    All other components within normal limits  URINALYSIS, ROUTINE W REFLEX MICROSCOPIC - Abnormal; Notable for the following:    APPearance CLOUDY (*)    Hgb urine dipstick MODERATE (*)    Leukocytes, UA LARGE (*)    All other components within normal limits  URINALYSIS, MICROSCOPIC (REFLEX) - Abnormal; Notable for the following:    Bacteria, UA FEW (*)    Squamous Epithelial / LPF 0-5 (*)    All other components within normal limits  URINE CULTURE    URINE CULTURE  CBC WITH DIFFERENTIAL/PLATELET  LIPASE, BLOOD    EKG  EKG Interpretation None       Radiology Ct Abdomen Pelvis W Contrast  Result Date: 07/18/2016 CLINICAL DATA:  Acute left lower quadrant abdominal pain, diarrhea EXAM: CT ABDOMEN AND PELVIS WITH CONTRAST TECHNIQUE: Multidetector CT imaging of the abdomen and pelvis was performed using the standard protocol following bolus administration of intravenous contrast. CONTRAST:  ISOVUE-300 IOPAMIDOL (ISOVUE-300) INJECTION 61% COMPARISON:  None. FINDINGS: Lower chest: No acute abnormality. Hepatobiliary: No focal liver abnormality is seen. No gallstones, gallbladder wall thickening, or biliary dilatation. Pancreas: Unremarkable. No pancreatic ductal dilatation or surrounding inflammatory changes. Spleen: Normal in size without focal abnormality. Adrenals/Urinary Tract: Adrenal glands are unremarkable. Large staghorn calculus is noted in right kidney. Moderate left hydronephrosis is noted secondary to 8 mm proximal left ureteral calculus. Urinary bladder is unremarkable. Stomach/Bowel: Stomach is within normal limits. Appendix appears normal. No evidence of bowel wall thickening, distention, or inflammatory changes. Vascular/Lymphatic: No significant vascular findings are present. No enlarged abdominal or pelvic lymph nodes. Reproductive: Uterus and bilateral adnexa are unremarkable. Other: No abdominal wall hernia or abnormality. No abdominopelvic ascites. Musculoskeletal: No acute or significant osseous findings. IMPRESSION: Large right renal staghorn calculus. Moderate left hydronephrosis is noted secondary to 8 mm proximal left ureteral calculus. Electronically Signed   By: Lupita Raider, M.D.   On: 07/18/2016 12:30    Procedures Procedures (including critical care time)  Medications Ordered in ED Medications  sodium chloride 0.9 % bolus 1,000 mL (0 mLs Intravenous Stopped 07/18/16 1259)  morphine 4 MG/ML injection 4 mg  (4 mg Intravenous Given 07/18/16 1219)  ondansetron (ZOFRAN) injection 4 mg (4 mg Intravenous Given 07/18/16 1219)  iopamidol (ISOVUE-300) 61 % injection 100 mL (100 mLs Intravenous Contrast Given 07/18/16 1209)     Initial Impression / Assessment and Plan / ED Course  I have reviewed the triage vital signs and the nursing notes.  Pertinent labs & imaging results that were available during my care of the patient were reviewed by me and considered in my medical decision making (see chart for details).  Clinical Course as of Jul 19 1503  Thu Jul 18, 2016  1306 UA appears contaminated.  Discussed with Dr Ethelda Chick, will run a cath specimen.    [EW]    Clinical Course User Index [EW] Chad, Linea Calles, [place]   Afebrile, nontoxic patient with LLQ abdominal pain and diarrhea x 1.5 weeks.  Labs unremarkable.  CT abd/pelvis demonstrates left ureteral stone with moderae hydronephrosis.  Also right renal staghorn calculus.  Symptoms are left sided.  No fevers, systemic symptoms, no urinary symptoms.  Initial UA was contaminated specimen.  Cath  ordered but pt declined, repeated specimen with increased cleansing - does not appear infected.  WBC increased but few bacteria.  Sent for culture.  Pt remains pain free. D/C home with percocet, phenergan, flomax, urology follow up.  Discussed culture pending and strict return precautions.  Discussed result, findings, treatment, and follow up  with patient.  Pt given return precautions.  Pt verbalizes understanding and agrees with plan.       Final Clinical Impressions(s) / ED Diagnoses   Final diagnoses:  Left ureteral stone  Staghorn renal calculus    New Prescriptions New Prescriptions   OXYCODONE-ACETAMINOPHEN (PERCOCET/ROXICET) 5-325 MG TABLET    Take 1-2 tablets by mouth every 4 (four) hours as needed for severe pain.   PROMETHAZINE (PHENERGAN) 25 MG TABLET    Take 1 tablet (25 mg total) by mouth every 6 (six) hours as needed for nausea.   TAMSULOSIN (FLOMAX)  0.4 MG CAPS CAPSULE    Take 1 capsule (0.4 mg total) by mouth daily.     Trixie DredgeWest, Diannie Willner, PA-C 07/18/16 1505    Doug SouJacubowitz, Sam, MD 07/18/16 [phone]

## 2016-07-19 LAB — URINE CULTURE

## 2016-11-08 ENCOUNTER — Encounter (HOSPITAL_BASED_OUTPATIENT_CLINIC_OR_DEPARTMENT_OTHER): Payer: Self-pay | Admitting: *Deleted

## 2016-11-08 ENCOUNTER — Emergency Department (HOSPITAL_BASED_OUTPATIENT_CLINIC_OR_DEPARTMENT_OTHER)
Admission: EM | Admit: 2016-11-08 | Discharge: 2016-11-08 | Disposition: A | Discharge status: Home / Self Care | Payer: Self-pay | Attending: Emergency Medicine | Admitting: Emergency Medicine

## 2016-11-08 DIAGNOSIS — Z5321 Procedure and treatment not carried out due to patient leaving prior to being seen by health care provider: Secondary | ICD-10-CM | POA: Insufficient documentation

## 2016-11-08 DIAGNOSIS — R05 Cough: Secondary | ICD-10-CM | POA: Insufficient documentation

## 2016-11-08 DIAGNOSIS — R0989 Other specified symptoms and signs involving the circulatory and respiratory systems: Secondary | ICD-10-CM | POA: Insufficient documentation

## 2016-11-08 NOTE — ED Triage Notes (Signed)
Cough x 2 days. Runny nose with yellow drainage.

## 2016-11-08 NOTE — ED Notes (Signed)
Pt states she has an appointment and has to leave. Encouraged to stay and be seen by md, pt refuses, states "I might come back later, but I have to go now." Amb out doors of ER with quick steady gait smiling in nad.

## 2017-05-27 IMAGING — DX DG CHEST 2V
2 series · 2 of 2 positions shown · non-contrast
Comparison: Report of a chest x-ray April 21, 1998.

CLINICAL DATA: Flu-like symptoms for the past 6 days. History of
diabetes, current smoker.

EXAM:
CHEST  2 VIEW

[chest pa]
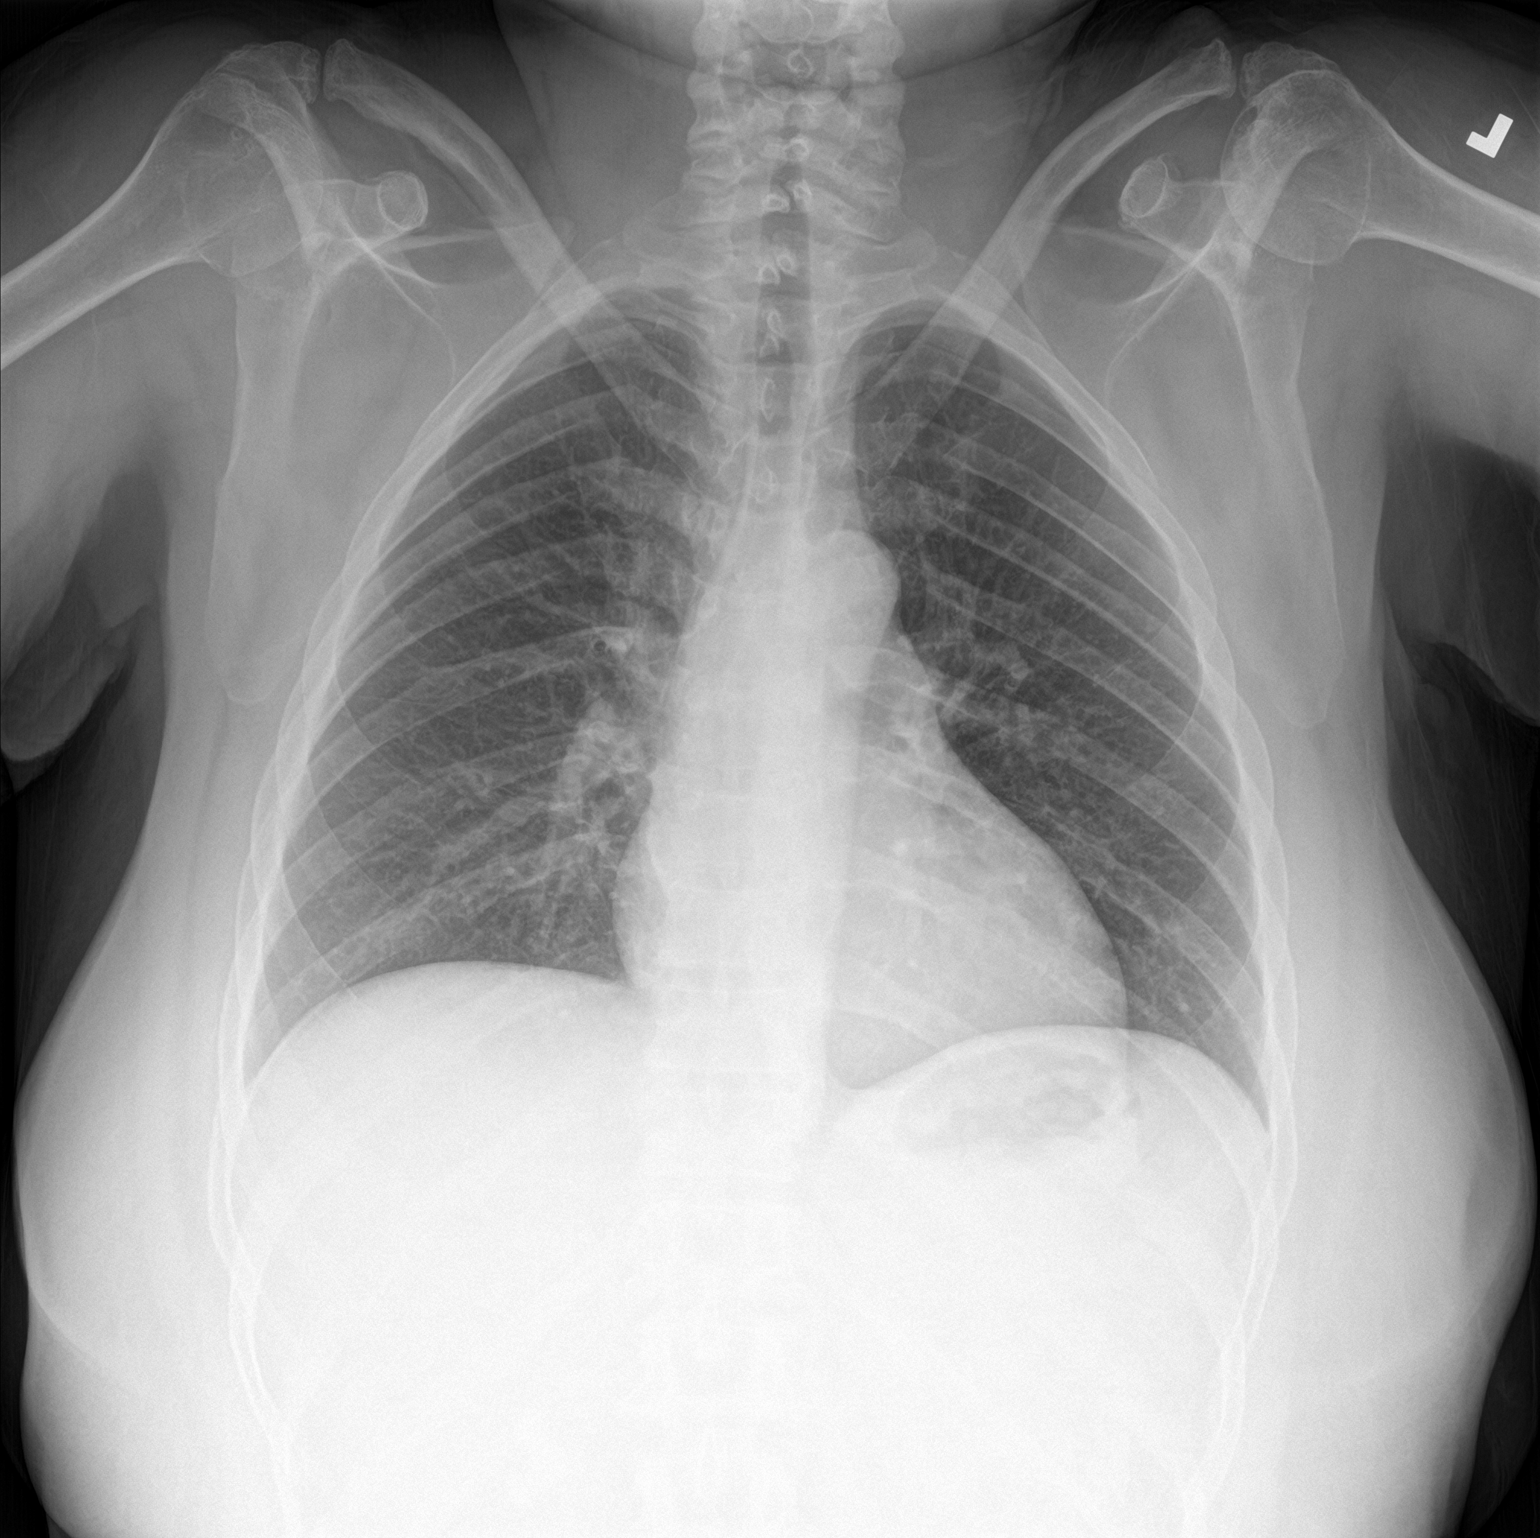

[chest lat]
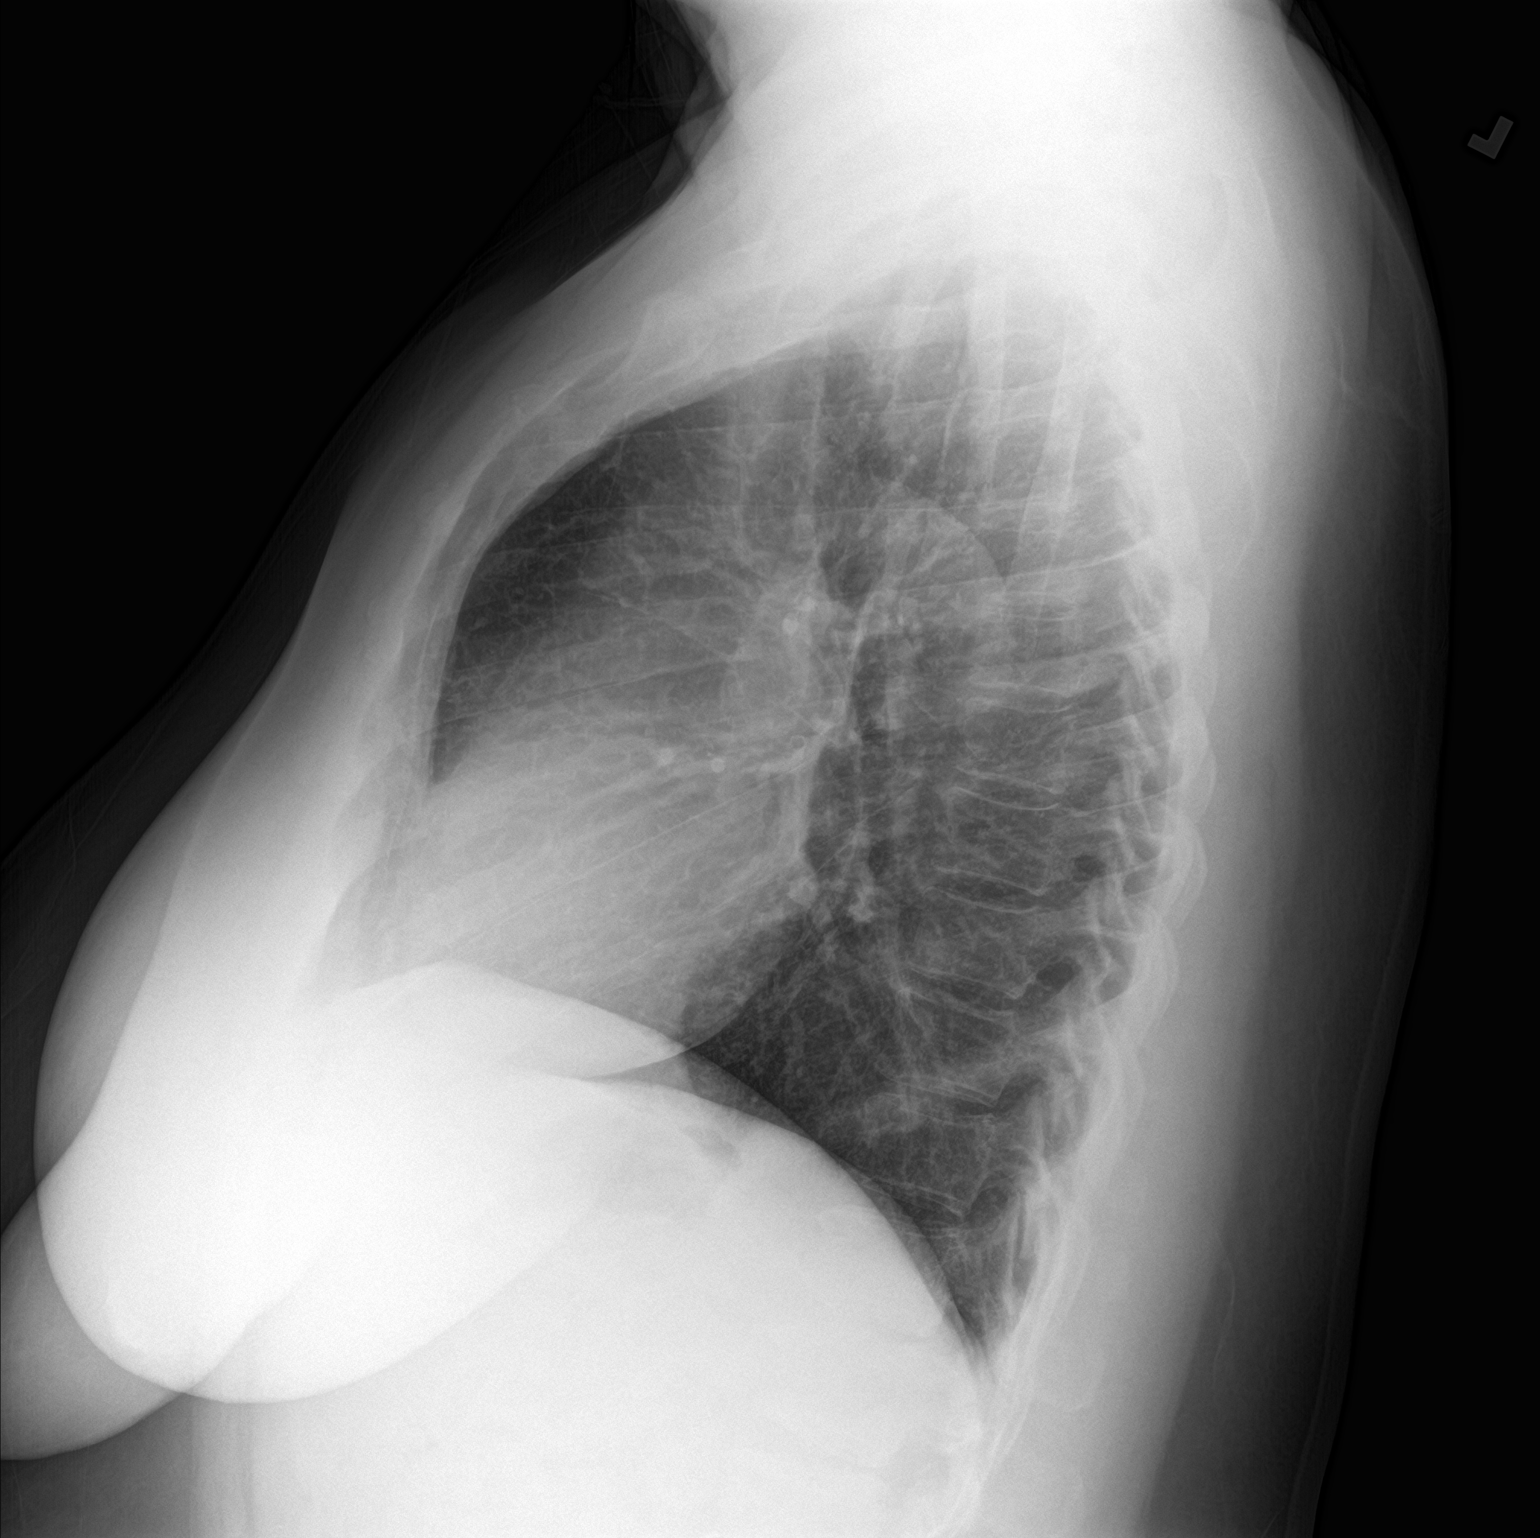

[2 of 2 positions shown; findings below may reference images not displayed]

FINDINGS: The lungs are adequately inflated and clear. The heart and pulmonary
vascularity are normal. The mediastinum is normal in width. There is
no pleural effusion. The trachea is midline. The bony thorax
exhibits no acute abnormality.
IMPRESSION: There is no pneumonia, CHF, nor other acute cardiopulmonary
abnormality.

## 2018-12-22 ENCOUNTER — Emergency Department (HOSPITAL_BASED_OUTPATIENT_CLINIC_OR_DEPARTMENT_OTHER)
Admission: EM | Admit: 2018-12-22 | Discharge: 2018-12-22 | Disposition: A | Discharge status: Home / Self Care | Payer: Self-pay | Attending: Emergency Medicine | Admitting: Emergency Medicine

## 2018-12-22 ENCOUNTER — Other Ambulatory Visit: Payer: Self-pay

## 2018-12-22 ENCOUNTER — Encounter (HOSPITAL_BASED_OUTPATIENT_CLINIC_OR_DEPARTMENT_OTHER): Payer: Self-pay | Admitting: *Deleted

## 2018-12-22 DIAGNOSIS — F17210 Nicotine dependence, cigarettes, uncomplicated: Secondary | ICD-10-CM | POA: Insufficient documentation

## 2018-12-22 DIAGNOSIS — Z760 Encounter for issue of repeat prescription: Secondary | ICD-10-CM | POA: Insufficient documentation

## 2018-12-22 DIAGNOSIS — E119 Type 2 diabetes mellitus without complications: Secondary | ICD-10-CM | POA: Insufficient documentation

## 2018-12-22 MED ORDER — MELOXICAM 15 MG PO TABS: 15.0000 mg | ORAL_TABLET | Freq: Every day | ORAL | 0 refills | Status: AC

## 2018-12-22 MED ORDER — METFORMIN HCL 500 MG PO TABS: 500.0000 mg | ORAL_TABLET | Freq: Two times a day (BID) | ORAL | 0 refills | Status: AC

## 2018-12-22 MED ORDER — CYCLOBENZAPRINE HCL 10 MG PO TABS: 10.0000 mg | ORAL_TABLET | Freq: Two times a day (BID) | ORAL | 0 refills | Status: AC | PRN

## 2018-12-22 NOTE — ED Triage Notes (Signed)
She ran out Meloxicam and Metformin and needs a refill.

## 2018-12-22 NOTE — ED Notes (Addendum)
Pt presents to ED for rx refill.  Denies any untoward sx that are not her usual state of being.

## 2018-12-22 NOTE — ED Provider Notes (Addendum)
[facility] EMERGENCY DEPARTMENT Provider Note   CSN: [id number] Arrival date & time: 12/22/18  8527     History   Chief Complaint Chief Complaint  Patient presents with  . Medication Refill    HPI Jamie Frank is a 58 y.o. female with history of diabetes, recovering drug abuse who presents for medication refill of Mobic, Flexeril, and metformin.  Patient reports she ran out 2 days ago.  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  She has been having little bit more inflammation since she stopped taking this.  She has an appointment to establish care with a PCP in 2 weeks, but she cannot wait that long for her medication refills.  She denies any other complaints.     HPI  Past Medical History:  Diagnosis Date  . Diabetes mellitus without complication (HCC)   . Drug abuse (HCC)    recovering for 2 yrs now    There are no active problems to display for this patient.   History reviewed. No pertinent surgical history.   OB History   No obstetric history on file.      Home Medications    Prior to Admission medications   Medication Sig Start Date End Date Taking? Authorizing Provider  GLIPIZIDE PO Take by mouth.   Yes [provider]  cyclobenzaprine (FLEXERIL) 10 MG tablet Take 1 tablet (10 mg total) by mouth 2 (two) times daily as needed for muscle spasms. 12/22/18   Kary Sugrue, Waylan Boga, PA-C  meloxicam (MOBIC) 15 MG tablet Take 1 tablet (15 mg total) by mouth daily. 12/22/18   Rashonda Warrior, Waylan Boga, PA-C  metFORMIN (GLUCOPHAGE) 500 MG tablet Take 1 tablet (500 mg total) by mouth 2 (two) times daily with a meal. 12/22/18   Nuri Larmer, Waylan Boga, PA-C  oxyCODONE-acetaminophen (PERCOCET/ROXICET) 5-325 MG tablet Take 1-2 tablets by mouth every 4 (four) hours as needed for severe pain. 07/18/16   Trixie Dredge, PA-C  promethazine (PHENERGAN) 25 MG tablet Take 1 tablet (25 mg total) by mouth every 6 (six) hours as needed for nausea. 07/18/16   Trixie Dredge, PA-C  tamsulosin (FLOMAX) 0.4 MG CAPS capsule Take 1 capsule (0.4 mg total) by mouth daily. 07/18/16   Trixie Dredge, PA-C    Family History No family history on file.  Social History Social History   Tobacco Use  . Smoking status: Current Every Day Smoker    Types: Cigarettes  . Smokeless tobacco: Never Used  Substance Use Topics  . Alcohol use: No  . Drug use: No     Allergies   Vicodin [hydrocodone-acetaminophen]   Review of Systems Review of Systems  Constitutional: Negative for fever.  Musculoskeletal: Positive for arthralgias.     Physical Exam Updated Vital Signs BP (!) 142/74   Pulse 78   Temp 98.2 F (36.8 C) (Oral)   Resp 20   Ht 5\' 5"  (1.651 m)   Wt 88.4 kg   SpO2 100%   BMI 32.43 kg/m   Physical Exam Vitals signs and nursing note reviewed.  Constitutional:      General: She is not in acute distress.    Appearance: She is well-developed. She is not diaphoretic.  HENT:     Head: Normocephalic and atraumatic.  Eyes:     General: No scleral icterus.       Right eye: No discharge.        Left eye: No discharge.     Conjunctiva/sclera:  Conjunctivae normal.  Pulmonary:     Effort: Pulmonary effort is normal. No respiratory distress.  Skin:    General: Skin is warm and dry.     Coloration: Skin is not pale.     Findings: No rash.  Neurological:     Mental Status: She is alert and oriented to person, place, and time.     Coordination: Coordination normal.  Psychiatric:        Behavior: Behavior normal.        Thought Content: Thought content normal.        Judgment: Judgment normal.      ED Treatments / Results  Labs (all labs ordered are listed, but only abnormal results are displayed) Labs Reviewed - No data to display  EKG None  Radiology No results found.  Procedures Procedures (including critical care time)  Medications Ordered in ED Medications - No data to display   Initial Impression / Assessment and Plan / ED  Course  I have reviewed the triage vital signs and the nursing notes.  Pertinent labs & imaging results that were available during my care of the patient were reviewed by me and considered in my medical decision making (see chart for details).        Pt here for refill of medication. Medications are not controlled substances. Will refill medication here. Discussed need to follow up with PCP at scheduled appointment in a couple weeks.  Pt is safe for discharge at this time.   Final Clinical Impressions(s) / ED Diagnoses   Final diagnoses:  Medication refill    ED Discharge Orders         Ordered    cyclobenzaprine (FLEXERIL) 10 MG tablet  2 times daily PRN     12/22/18 1901    metFORMIN (GLUCOPHAGE) 500 MG tablet  2 times daily with meals     12/22/18 1901    meloxicam (MOBIC) 15 MG tablet  Daily     12/22/18 1901             Frederica Kuster, PA-C 12/22/18 2050    Margette Fast, MD 12/25/18 [phone]

## 2019-01-22 ENCOUNTER — Emergency Department (HOSPITAL_BASED_OUTPATIENT_CLINIC_OR_DEPARTMENT_OTHER): Payer: Medicaid Other

## 2019-01-22 ENCOUNTER — Other Ambulatory Visit: Payer: Self-pay

## 2019-01-22 ENCOUNTER — Emergency Department (HOSPITAL_BASED_OUTPATIENT_CLINIC_OR_DEPARTMENT_OTHER)
Admission: EM | Admit: 2019-01-22 | Discharge: 2019-01-22 | Disposition: A | Discharge status: Home / Self Care | Payer: Medicaid Other | Attending: Emergency Medicine | Admitting: Emergency Medicine

## 2019-01-22 DIAGNOSIS — Y9389 Activity, other specified: Secondary | ICD-10-CM | POA: Insufficient documentation

## 2019-01-22 DIAGNOSIS — W010XXA Fall on same level from slipping, tripping and stumbling without subsequent striking against object, initial encounter: Secondary | ICD-10-CM | POA: Insufficient documentation

## 2019-01-22 DIAGNOSIS — Y9289 Other specified places as the place of occurrence of the external cause: Secondary | ICD-10-CM | POA: Insufficient documentation

## 2019-01-22 DIAGNOSIS — S79912A Unspecified injury of left hip, initial encounter: Secondary | ICD-10-CM | POA: Insufficient documentation

## 2019-01-22 DIAGNOSIS — Y998 Other external cause status: Secondary | ICD-10-CM | POA: Insufficient documentation

## 2019-01-22 DIAGNOSIS — M25552 Pain in left hip: Secondary | ICD-10-CM

## 2019-01-22 DIAGNOSIS — Z79899 Other long term (current) drug therapy: Secondary | ICD-10-CM | POA: Insufficient documentation

## 2019-01-22 DIAGNOSIS — G8929 Other chronic pain: Secondary | ICD-10-CM | POA: Insufficient documentation

## 2019-01-22 DIAGNOSIS — F17210 Nicotine dependence, cigarettes, uncomplicated: Secondary | ICD-10-CM | POA: Insufficient documentation

## 2019-01-22 DIAGNOSIS — W19XXXA Unspecified fall, initial encounter: Secondary | ICD-10-CM

## 2019-01-22 DIAGNOSIS — E119 Type 2 diabetes mellitus without complications: Secondary | ICD-10-CM | POA: Insufficient documentation

## 2019-01-22 MED ORDER — KETOROLAC TROMETHAMINE 60 MG/2ML IM SOLN
30.0000 mg | Freq: Once | INTRAMUSCULAR | Status: AC
  Administered 2019-01-22: 30 mg via INTRAMUSCULAR
  Filled 2019-01-22: qty 2

## 2019-01-22 MED ORDER — OXYCODONE-ACETAMINOPHEN 5-325 MG PO TABS: 1.0000 | ORAL_TABLET | Freq: Once | ORAL | Status: DC

## 2019-01-22 NOTE — ED Provider Notes (Signed)
[facility] EMERGENCY DEPARTMENT Provider Note   CSN: [id number] Arrival date & time: 01/22/19  1013     History   Chief Complaint Chief Complaint  Patient presents with  . Fall  . Leg Pain    HPI Jamie Frank is a 58 y.o. female with chronic left hip pain who presents with L hip pain and a fall.  Patient states that on Monday she was crossing the street and it was wet and she slipped and fell onto her left side.  This aggravated her left hip.  She has been able to ambulate since then however today she was going down a ramp and slipped again and fell onto the left hip.  She was able to get up again after a period of time.  She decided to call EMS because she did not want to ride the bus here.  She states that she has been getting injections in her hip at [facility] (for trochanteric bursitis).  She is scheduled to see an orthopedic doctor on December 3.  She has been told she has "bone-on-bone" arthritis in the left hip.  She denies any back or knee pain.     HPI  Past Medical History:  Diagnosis Date  . Diabetes mellitus without complication (HCC)   . Drug abuse (HCC)    recovering for 2 yrs now    There are no active problems to display for this patient.   No past surgical history on file.   OB History   No obstetric history on file.      Home Medications    Prior to Admission medications   Medication Sig Start Date End Date Taking? Authorizing Provider  GLIPIZIDE PO Take by mouth.   Yes [provider]  meloxicam (MOBIC) 15 MG tablet Take 1 tablet (15 mg total) by mouth daily. 12/22/18  Yes Law, Alexandra M, PA-C  metFORMIN (GLUCOPHAGE) 500 MG tablet Take 1 tablet (500 mg total) by mouth 2 (two) times daily with a meal. 12/22/18  Yes Law, Alexandra M, PA-C  tamsulosin (FLOMAX) 0.4 MG CAPS capsule Take 1 capsule (0.4 mg total) by mouth daily. 07/18/16  Yes West, Emily, PA-C  cyclobenzaprine (FLEXERIL) 10 MG tablet Take 1 tablet (10 mg total) by mouth  2 (two) times daily as needed for muscle spasms. 12/22/18   Emi Holes, PA-C  oxyCODONE-acetaminophen (PERCOCET/ROXICET) 5-325 MG tablet Take 1-2 tablets by mouth every 4 (four) hours as needed for severe pain. 07/18/16   Trixie Dredge, PA-C  promethazine (PHENERGAN) 25 MG tablet Take 1 tablet (25 mg total) by mouth every 6 (six) hours as needed for nausea. 07/18/16   Trixie Dredge, PA-C    Family History No family history on file.  Social History Social History   Tobacco Use  . Smoking status: Current Every Day Smoker    Types: Cigarettes  . Smokeless tobacco: Never Used  Substance Use Topics  . Alcohol use: No  . Drug use: No     Allergies   Vicodin [hydrocodone-acetaminophen]   Review of Systems Review of Systems  Musculoskeletal: Positive for arthralgias.  Skin: Negative for wound.  Neurological: Negative for weakness and numbness.     Physical Exam Updated Vital Signs BP (!) 154/87 (BP Location: Right Arm)   Pulse 80   Temp 98 F (36.7 C) (Oral)   Resp 16   SpO2 98%   Physical Exam Vitals signs and nursing note reviewed.  Constitutional:      General: She  is not in acute distress.    Appearance: Normal appearance. She is well-developed. She is not ill-appearing.  HENT:     Head: Normocephalic and atraumatic.  Eyes:     General: No scleral icterus.       Right eye: No discharge.        Left eye: No discharge.     Conjunctiva/sclera: Conjunctivae normal.     Pupils: Pupils are equal, round, and reactive to light.  Neck:     Musculoskeletal: Normal range of motion.  Cardiovascular:     Rate and Rhythm: Normal rate.  Pulmonary:     Effort: Pulmonary effort is normal. No respiratory distress.  Abdominal:     General: There is no distension.  Musculoskeletal:     Comments: Left hip: No obvious swelling or deformity. Tenderness over the lateral and anterior hip. Able to range. No thigh or knee tenderness. 2+ DP pulse  Skin:    General: Skin is warm and  dry.  Neurological:     Mental Status: She is alert and oriented to person, place, and time.  Psychiatric:        Behavior: Behavior normal.      ED Treatments / Results  Labs (all labs ordered are listed, but only abnormal results are displayed) Labs Reviewed - No data to display  EKG None  Radiology Dg Hip Unilat W Or Wo Pelvis 2-3 Views Left  Result Date: 01/22/2019 CLINICAL DATA:  Pain following fall EXAM: DG HIP (WITH OR WITHOUT PELVIS) 2-3V LEFT COMPARISON:  None. FINDINGS: Frontal pelvis as well as frontal and lateral left hip images were obtained. There is no acute fracture or dislocation. There is advanced osteoarthritic change in the left hip joint with marked joint space narrowing and subchondral cystic change in the acetabulum and femoral head regions. There is milder osteoarthritic change involving the right hip joint. Sacroiliac joints appear unremarkable bilaterally. There is no erosive change. 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 Mild osteoarthritic change involving right hip joint. No fracture or dislocation. Electronically Signed   By: Lowella Grip III M.D.   On: 01/22/2019 10:52    Procedures Procedures (including critical care time)  Medications Ordered in ED Medications  ketorolac (TORADOL) injection 30 mg (30 mg Intramuscular Given 01/22/19 1048)     Initial Impression / Assessment and Plan / ED Course  I have reviewed the triage vital signs and the nursing notes.  Pertinent labs & imaging results that were available during my care of the patient were reviewed by me and considered in my medical decision making (see chart for details).  58 year old female presents with acute on chronic left hip pain after multiple falls over the past week.  She is tender over the left hip.  There is no obvious deformity or injury noted.  Will obtain x-ray of the hip and provide pain control.  X-ray shows extensive arthritis of the  left hip and mild arthritis of the right hip.  Discussed results with the patient.  She is requesting crackers and a work note.  She has follow-up with Ortho in a couple of weeks.  She is advised to return if worsening.  Final Clinical Impressions(s) / ED Diagnoses   Final diagnoses:  Fall, initial encounter  Chronic left hip pain    ED Discharge Orders    None       Recardo Evangelist, PA-C 01/22/19 1144    Little, Wenda Overland, MD 01/22/19 1210  Little, Ambrose Finlandachel Morgan, MD 01/22/19 1211    Clarene DukeLittle, Ambrose Finlandachel Morgan, MD 01/22/19 [phone]

## 2019-01-22 NOTE — ED Notes (Signed)
Patient transported to X-ray 

## 2019-01-22 NOTE — Discharge Instructions (Signed)
Continue Meloxicam for pain and inflammation Please follow up with orthopedics Return if you are worsening

## 2019-01-22 NOTE — ED Triage Notes (Signed)
Per ems pt fell Monday, described left hip pain at that time, able to ambulate, treating OTC medications.  [place] again this morning, worsening left hip pain, unable to bear weight without pain, called ems.

## 2019-03-30 ENCOUNTER — Other Ambulatory Visit: Payer: Self-pay

## 2019-03-30 ENCOUNTER — Encounter (HOSPITAL_BASED_OUTPATIENT_CLINIC_OR_DEPARTMENT_OTHER): Payer: Self-pay | Admitting: Emergency Medicine

## 2019-03-30 ENCOUNTER — Emergency Department (HOSPITAL_BASED_OUTPATIENT_CLINIC_OR_DEPARTMENT_OTHER)
Admission: EM | Admit: 2019-03-30 | Discharge: 2019-03-30 | Disposition: A | Discharge status: Home / Self Care | Payer: Medicaid Other | Attending: Emergency Medicine | Admitting: Emergency Medicine

## 2019-03-30 DIAGNOSIS — Z885 Allergy status to narcotic agent status: Secondary | ICD-10-CM | POA: Insufficient documentation

## 2019-03-30 DIAGNOSIS — R197 Diarrhea, unspecified: Secondary | ICD-10-CM | POA: Insufficient documentation

## 2019-03-30 DIAGNOSIS — E119 Type 2 diabetes mellitus without complications: Secondary | ICD-10-CM | POA: Insufficient documentation

## 2019-03-30 DIAGNOSIS — I10 Essential (primary) hypertension: Secondary | ICD-10-CM | POA: Insufficient documentation

## 2019-03-30 DIAGNOSIS — F17210 Nicotine dependence, cigarettes, uncomplicated: Secondary | ICD-10-CM | POA: Insufficient documentation

## 2019-03-30 DIAGNOSIS — R112 Nausea with vomiting, unspecified: Secondary | ICD-10-CM | POA: Insufficient documentation

## 2019-03-30 DIAGNOSIS — Z20822 Contact with and (suspected) exposure to covid-19: Secondary | ICD-10-CM | POA: Insufficient documentation

## 2019-03-30 DIAGNOSIS — R1084 Generalized abdominal pain: Secondary | ICD-10-CM

## 2019-03-30 HISTORY — DX: Essential (primary) hypertension: I10

## 2019-03-30 LAB — COMPREHENSIVE METABOLIC PANEL
ALT: 19 U/L (ref 0–44)
AST: 16 U/L (ref 15–41)
Albumin: 3.7 g/dL (ref 3.5–5.0)
Alkaline Phosphatase: 73 U/L (ref 38–126)
Anion gap: 9 (ref 5–15)
BUN: 13 mg/dL (ref 6–20)
CO2: 23 mmol/L (ref 22–32)
Calcium: 9.5 mg/dL (ref 8.9–10.3)
Chloride: 105 mmol/L (ref 98–111)
Creatinine, Ser: 0.81 mg/dL (ref 0.44–1.00)
GFR calc Af Amer: >60 mL/min (ref >60)
GFR calc non Af Amer: >60 mL/min (ref >60)
Glucose, Bld: 247 mg/dL — ABNORMAL HIGH (ref 70–99)
Potassium: 3.6 mmol/L (ref 3.5–5.1)
Sodium: 137 mmol/L (ref 135–145)
Total Bilirubin: 0.9 mg/dL (ref 0.3–1.2)
Total Protein: 6.9 g/dL (ref 6.5–8.1)

## 2019-03-30 LAB — LIPASE, BLOOD: Lipase: 41 U/L (ref 11–51)

## 2019-03-30 LAB — CBC WITH DIFFERENTIAL/PLATELET
Abs Immature Granulocytes: 0.03 10*3/uL (ref 0.00–0.07)
Basophils Absolute: 0.1 10*3/uL (ref 0.0–0.1)
Basophils Relative: 1 %
Eosinophils Absolute: 0.2 10*3/uL (ref 0.0–0.5)
Eosinophils Relative: 2 %
HCT: 46 % (ref 36.0–46.0)
Hemoglobin: 15.4 g/dL — ABNORMAL HIGH (ref 12.0–15.0)
Immature Granulocytes: 0 %
Lymphocytes Relative: 48 %
Lymphs Abs: 3.8 10*3/uL (ref 0.7–4.0)
MCH: 30 pg (ref 26.0–34.0)
MCHC: 33.5 g/dL (ref 30.0–36.0)
MCV: 89.7 fL (ref 80.0–100.0)
Monocytes Absolute: 0.7 10*3/uL (ref 0.1–1.0)
Monocytes Relative: 9 %
Neutro Abs: 3.1 10*3/uL (ref 1.7–7.7)
Neutrophils Relative %: 40 %
Platelets: 222 10*3/uL (ref 150–400)
RBC: 5.13 MIL/uL — ABNORMAL HIGH (ref 3.87–5.11)
RDW: 13.3 % (ref 11.5–15.5)
WBC: 7.8 10*3/uL (ref 4.0–10.5)
nRBC: 0 % (ref 0.0–0.2)

## 2019-03-30 MED ORDER — DICYCLOMINE HCL 20 MG PO TABS: 20.0000 mg | ORAL_TABLET | Freq: Three times a day (TID) | ORAL | 0 refills | Status: AC | PRN

## 2019-03-30 MED ORDER — LOPERAMIDE HCL 2 MG PO CAPS: 2.0000 mg | ORAL_CAPSULE | Freq: Four times a day (QID) | ORAL | 0 refills | Status: AC | PRN

## 2019-03-30 MED ORDER — ONDANSETRON 4 MG PO TBDP: 4.0000 mg | ORAL_TABLET | Freq: Three times a day (TID) | ORAL | 0 refills | Status: AC | PRN

## 2019-03-30 MED ORDER — SODIUM CHLORIDE 0.9 % IV BOLUS
500.0000 mL | Freq: Once | INTRAVENOUS | Status: AC
  Administered 2019-03-30: 500 mL via INTRAVENOUS

## 2019-03-30 MED FILL — ONDANSETRON ODT 4 MG TABLET: 4 | 6 days supply | Qty: 20 | Fill #0

## 2019-03-30 MED FILL — DICYCLOMINE 20 MG TABLET: 20 | 7 days supply | Qty: 20 | Fill #0

## 2019-03-30 MED FILL — ANTI-DIARRHEAL 2 MG TABS: 2 | 6 days supply | Qty: 24 | Fill #0

## 2019-03-30 NOTE — ED Triage Notes (Signed)
Pt c/o mid abdominal pain x 3 days. Pt denies N/V at this time, denies diarrhea at this time as well. Pt reports subj low grade fever. Normal gait

## 2019-03-30 NOTE — ED Provider Notes (Signed)
Emergency Department Provider Note   I have reviewed the triage vital signs and the nursing notes.   HISTORY  Chief Complaint Abdominal Pain   HPI Jamie Frank is a 59 y.o. female with PMH of DM and HTN presents to the emergency department for evaluation of diffuse mild abdominal discomfort with initial symptoms of vomiting and diarrhea.  She notes some mild congestion but no sore throat, loss of smell/taste, shortness of breath, chest pain.  Patient does work in [employer] and has been out of work for several days with vomiting and diarrhea.  She was advised to present to the emergency department for evaluation and to advise on return to work.  She continues to have mild/moderate diffuse abdominal discomfort but nothing focal.  Vomiting and diarrhea symptoms have mostly resolved with some yesterday but none today.  No radiation of symptoms or other modifying factors.  No known COVID-19 contacts.    Past Medical History:  Diagnosis Date  . Diabetes mellitus without complication ([place])   . Drug abuse ([place])    recovering for 2 yrs now  . Hypertension     There are no problems to display for this patient.   History reviewed. No pertinent surgical history.  Allergies Vicodin [hydrocodone-acetaminophen]  History reviewed. No pertinent family history.  Social History Social History   Tobacco Use  . Smoking status: Current Every Day Smoker    Types: Cigarettes  . Smokeless tobacco: Never Used  Substance Use Topics  . Alcohol use: No  . Drug use: No    Review of Systems  Constitutional: No fever/chills Eyes: No visual changes. ENT: No sore throat. Mild congestion.  Cardiovascular: Denies chest pain. Respiratory: Denies shortness of breath. Gastrointestinal: Positive abdominal pain. Positive nausea, vomiting, and diarrhea.  No constipation. Genitourinary: Negative for dysuria. Musculoskeletal: Negative for back pain. Skin: Negative for rash. Neurological:  Negative for headaches, focal weakness or numbness.  10-point ROS otherwise negative.  ____________________________________________   PHYSICAL EXAM:  VITAL SIGNS: ED Triage Vitals  Enc Vitals Group     BP 03/30/19 0842 (!) 141/79     Pulse Rate 03/30/19 0842 94     Resp 03/30/19 0842 16     Temp 03/30/19 0842 98.9 F (37.2 C)     Temp Source 03/30/19 0842 Oral     SpO2 03/30/19 0842 100 %     Weight 03/30/19 0843 164 lb (74.4 kg)     Height 03/30/19 0843 5\' 5"  (1.651 m)   Constitutional: Alert and oriented. Well appearing and in no acute distress. Eyes: Conjunctivae are normal.  Head: Atraumatic. Nose: No congestion/rhinnorhea. Mouth/Throat: Mucous membranes are moist.  Neck: No stridor. Cardiovascular: Normal rate, regular rhythm. Good peripheral circulation. Grossly normal heart sounds.   Respiratory: Normal respiratory effort.  No retractions. Lungs CTAB. Gastrointestinal: Soft and nontender. No RUQ tenderness or Murphy's sign. No distention.  Musculoskeletal: No gross deformities of extremities. Neurologic:  Normal speech and language.  Skin:  Skin is warm, dry and intact. No rash noted.  ____________________________________________   LABS (all labs ordered are listed, but only abnormal results are displayed)  Labs Reviewed  COMPREHENSIVE METABOLIC PANEL - Abnormal; Notable for the following components:      Result Value   Glucose, Bld 247 (*)    All other components within normal limits  CBC WITH DIFFERENTIAL/PLATELET - Abnormal; Notable for the following components:   RBC 5.13 (*)    Hemoglobin 15.4 (*)    All other components within  normal limits  NOVEL CORONAVIRUS, NAA (HOSP ORDER, SEND-OUT TO REF LAB; TAT 18-24 HRS)  LIPASE, BLOOD   ____________________________________________  RADIOLOGY  None  ____________________________________________   PROCEDURES  Procedure(s) performed:   Procedures  None  ____________________________________________    INITIAL IMPRESSION / ASSESSMENT AND PLAN / ED COURSE  Pertinent labs & imaging results that were available during my care of the patient were reviewed by me and considered in my medical decision making (see chart for details).   Patient presents to the emergency department for evaluation of abdominal discomfort diffusely with nausea, vomiting, diarrhea.  Vomiting/diarrhea symptoms have mostly resolved.  She does have mild congestion.  Suspect viral etiology clinically likely a GI source.  Lower suspicion for COVID-19 but this would remain on my differential with mainly GI symptoms.  Her abdomen is diffusely soft and nontender.  Do not feel that CT imaging is warranted at this time.  We will follow labs and administer IV fluids.  Patient would likely need to be out of work for several more days. Will send COVID testing.   Labs reviewed. No acute findings. COVID pending. Patient to isolate and f/u results in MyChart app. Information on this provided at discharge. Discussed supportive care plan at home and ED return precautions.  ____________________________________________  FINAL CLINICAL IMPRESSION(S) / ED DIAGNOSES  Final diagnoses:  Generalized abdominal pain  Nausea vomiting and diarrhea     MEDICATIONS GIVEN DURING THIS VISIT:  Medications  sodium chloride 0.9 % bolus 500 mL (0 mLs Intravenous Stopped 03/30/19 1031)     NEW OUTPATIENT MEDICATIONS STARTED DURING THIS VISIT:  Discharge Medication List as of 03/30/2019 10:39 AM    START taking these medications   Details  dicyclomine (BENTYL) 20 MG tablet Take 1 tablet (20 mg total) by mouth 3 (three) times daily as needed for spasms (abdominal cramping)., Starting Tue 03/30/2019, Normal    loperamide (IMODIUM) 2 MG capsule Take 1 capsule (2 mg total) by mouth 4 (four) times daily as needed for diarrhea or loose stools., Starting Tue 03/30/2019, Normal    ondansetron (ZOFRAN ODT) 4 MG disintegrating tablet Take 1 tablet (4 mg total)  by mouth every 8 (eight) hours as needed., Starting Tue 03/30/2019, Normal        Note:  This document was prepared using Dragon voice recognition software and may include unintentional dictation errors.  Alona Bene, MD, [facility]    Long, Arlyss Repress, MD 03/31/19 [phone]

## 2019-03-30 NOTE — ED Notes (Signed)
Pt aware urine specimen needed, specimen cp provided. Pt reports inability to provide sample at this time

## 2019-03-30 NOTE — ED Notes (Signed)
Presents with abd pain, onset Sunday, had some nausea and vomiting with diarrhea. Last vomited yesterday, no further diarrhea. No meds taken for this issue. Poor appetite.

## 2019-03-30 NOTE — ED Notes (Signed)
ED Provider at bedside. 

## 2019-03-30 NOTE — Discharge Instructions (Signed)
You are seen in the emergency department today with abdominal pain with vomiting and diarrhea.  I have prescribed medications to help with the symptoms.  Your lab work today is reassuring.  I am testing you for COVID-19.  I am taking out of work for the next week pending those test results.  You can follow the results in the MyChart app and they should be resulted in the next 1 to 2 days.  If your symptoms completely resolve for 3 days and you have a negative COVID-19 test you could go back to work sooner.  Return to the emergency department any new or suddenly worsening symptoms.

## 2019-03-31 LAB — NOVEL CORONAVIRUS, NAA (HOSP ORDER, SEND-OUT TO REF LAB; TAT 18-24 HRS): SARS-CoV-2, NAA: NOT DETECTED
